# Patient Record
Sex: Female | Born: 1995 | Race: Black or African American | Hispanic: No | Marital: Single | Smoking: Former smoker
Health system: Southern US, Community
[De-identification: ages and names within clinical notes are randomized; demographics above are authoritative.]

## PROBLEM LIST (undated history)

## (undated) DIAGNOSIS — I10 Essential (primary) hypertension: Secondary | ICD-10-CM

## (undated) DIAGNOSIS — R030 Elevated blood-pressure reading, without diagnosis of hypertension: Secondary | ICD-10-CM

## (undated) DIAGNOSIS — R569 Unspecified convulsions: Secondary | ICD-10-CM

## (undated) DIAGNOSIS — K219 Gastro-esophageal reflux disease without esophagitis: Secondary | ICD-10-CM

## (undated) HISTORY — DX: Unspecified convulsions: R56.9

## (undated) HISTORY — DX: Elevated blood-pressure reading, without diagnosis of hypertension: R03.0

## (undated) HISTORY — DX: Essential (primary) hypertension: I10

## (undated) HISTORY — DX: Gastro-esophageal reflux disease without esophagitis: K21.9

---
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
## 2016-05-01 ENCOUNTER — Encounter (HOSPITAL_COMMUNITY): Payer: Self-pay

## 2016-05-01 ENCOUNTER — Emergency Department (HOSPITAL_COMMUNITY): Payer: Self-pay

## 2016-05-01 ENCOUNTER — Emergency Department (HOSPITAL_COMMUNITY)
Admission: EM | Admit: 2016-05-01 | Discharge: 2016-05-01 | Disposition: A | Discharge status: Home / Self Care | Payer: Self-pay | Attending: Emergency Medicine | Admitting: Emergency Medicine

## 2016-05-01 DIAGNOSIS — Z87891 Personal history of nicotine dependence: Secondary | ICD-10-CM | POA: Insufficient documentation

## 2016-05-01 DIAGNOSIS — R569 Unspecified convulsions: Secondary | ICD-10-CM | POA: Insufficient documentation

## 2016-05-01 DIAGNOSIS — R51 Headache: Secondary | ICD-10-CM | POA: Insufficient documentation

## 2016-05-01 DIAGNOSIS — E876 Hypokalemia: Secondary | ICD-10-CM | POA: Insufficient documentation

## 2016-05-01 LAB — URINALYSIS, ROUTINE W REFLEX MICROSCOPIC
Bilirubin Urine: NEGATIVE
GLUCOSE, UA: NEGATIVE mg/dL
Hgb urine dipstick: NEGATIVE
Ketones, ur: NEGATIVE mg/dL
LEUKOCYTES UA: NEGATIVE
Nitrite: NEGATIVE
PH: 8 (ref 5.0–8.0)
PROTEIN: NEGATIVE mg/dL
SPECIFIC GRAVITY, URINE: 1.019 (ref 1.005–1.030)

## 2016-05-01 LAB — RAPID URINE DRUG SCREEN, HOSP PERFORMED
AMPHETAMINES: NOT DETECTED
BARBITURATES: NOT DETECTED
BENZODIAZEPINES: NOT DETECTED
Cocaine: NOT DETECTED
Opiates: NOT DETECTED
TETRAHYDROCANNABINOL: POSITIVE — AB

## 2016-05-01 LAB — CBC WITH DIFFERENTIAL/PLATELET
BASOS ABS: 0 10*3/uL (ref 0.0–0.1)
Basophils Relative: 0 %
EOS ABS: 0 10*3/uL (ref 0.0–0.7)
EOS PCT: 0 %
HEMATOCRIT: 43.5 % (ref 36.0–46.0)
Hemoglobin: 14.5 g/dL (ref 12.0–15.0)
LYMPHS PCT: 21 %
Lymphs Abs: 2 10*3/uL (ref 0.7–4.0)
MCH: 30.8 pg (ref 26.0–34.0)
MCHC: 33.3 g/dL (ref 30.0–36.0)
MCV: 92.4 fL (ref 78.0–100.0)
MONO ABS: 0.6 10*3/uL (ref 0.1–1.0)
Monocytes Relative: 7 %
Neutro Abs: 6.8 10*3/uL (ref 1.7–7.7)
Neutrophils Relative %: 72 %
Platelets: 242 10*3/uL (ref 150–400)
RBC: 4.71 MIL/uL (ref 3.87–5.11)
RDW: 13.5 % (ref 11.5–15.5)
WBC: 9.5 10*3/uL (ref 4.0–10.5)

## 2016-05-01 LAB — POC URINE PREG, ED: Preg Test, Ur: NEGATIVE

## 2016-05-01 LAB — COMPREHENSIVE METABOLIC PANEL
ALBUMIN: 4.4 g/dL (ref 3.5–5.0)
ALK PHOS: 64 U/L (ref 38–126)
ALT: 9 U/L — AB (ref 14–54)
AST: 34 U/L (ref 15–41)
Anion gap: 11 (ref 5–15)
BILIRUBIN TOTAL: 0.5 mg/dL (ref 0.3–1.2)
CALCIUM: 10.1 mg/dL (ref 8.9–10.3)
CO2: 25 mmol/L (ref 22–32)
CREATININE: 0.7 mg/dL (ref 0.44–1.00)
Chloride: 103 mmol/L (ref 101–111)
GFR calc Af Amer: >60 mL/min (ref >60)
GLUCOSE: 83 mg/dL (ref 65–99)
POTASSIUM: 3.4 mmol/L — AB (ref 3.5–5.1)
Sodium: 139 mmol/L (ref 135–145)
TOTAL PROTEIN: 7.7 g/dL (ref 6.5–8.1)

## 2016-05-01 LAB — TROPONIN I: Troponin I: <0.03 ng/mL (ref <0.03)

## 2016-05-01 LAB — MAGNESIUM: MAGNESIUM: 2.1 mg/dL (ref 1.7–2.4)

## 2016-05-01 MED ORDER — ACETAMINOPHEN 500 MG PO TABS
1000.0000 mg | ORAL_TABLET | Freq: Once | ORAL | Status: AC
  Administered 2016-05-01: 1000 mg via ORAL
  Filled 2016-05-01: qty 2

## 2016-05-01 NOTE — ED Provider Notes (Signed)
[facility]ENCY DEPT Provider Note   CSN: [id number] Arrival date & time: 05/01/16  1605     History   Chief Complaint Chief Complaint  Patient presents with  . Seizures    HPI Meghan Williams is a 20 y.o. female.  HPI Patient has no seizure history. Medical history is for borderline hypertension that has not required treatment in the past. Patient reports that she was feeling well. She had no preceding symptoms. She was in the room with her cousin and she suddenly started convulsing. Her mother was called and came to the room. Her mother describes tonic-clonic activity of both arms and legs with foaming at the mouth. Her mother reports that she was wearing a lot of clothing including a large, fuzzy onesie. Her mother reports that she was sweaty and hot. She reports when she took her out of this this seizure seemed to stop. It took her several more minutes before she recognize her surroundings. Gradually on the way to the emergency department she has regained completely normal mental status. He denies any regular medications. She occasionally takes an NSAID for menstrual cramps. She has no seizure history. Was no injury associated. The patient was on a carpeted floor. There was no associated fall. Patient is denying headache, visual changes, gait instability. She is in college and she reports she has been feeling well. She is currently home visiting family. History reviewed. No pertinent past medical history.  There are no active problems to display for this patient.   History reviewed. No pertinent surgical history.  OB History    No data available       Home Medications    Prior to Admission medications   Medication Sig Start Date End Date Taking? Authorizing Provider  ibuprofen (ADVIL,MOTRIN) 200 MG tablet Take 400 mg by mouth every 6 (six) hours as needed.   Yes Historical Provider, MD    Family History No family history on file.  Social History Social History    Substance Use Topics  . Smoking status: Former Games developer  . Smokeless tobacco: Never Used  . Alcohol use Yes     Allergies   Review of patient's allergies indicates not on file.   Review of Systems Review of Systems 10 Systems reviewed and are negative for acute change except as noted in the HPI.  Physical Exam Updated Vital Signs BP 153/94 (BP Location: Left Arm)   Pulse 68   Temp 99 F (37.2 C) (Oral)   Resp 16   Ht 5\' 9"  (1.753 m)   Wt 123 lb (55.8 kg)   LMP 04/20/2016 (Exact Date)   SpO2 100%   BMI 18.16 kg/m   Physical Exam  Constitutional: She is oriented to person, place, and time. She appears well-developed and well-nourished. No distress.  HENT:  Head: Normocephalic and atraumatic.  Right Ear: External ear normal.  Left Ear: External ear normal.  Nose: Nose normal.  Mouth/Throat: Oropharynx is clear and moist.  Bilateral TMs normal. Dentition excellent condition. Mucous memories are pink and moist. No tongue laceration.  Eyes: Conjunctivae and EOM are normal. Pupils are equal, round, and reactive to light.  No nystagmus. No scleral injection  Neck: Normal range of motion. Neck supple.  No meningismus. No posterior cervical pain or bony tenderness. Normal soft tissues without lymphadenopathy or mass.  Cardiovascular: Normal rate and regular rhythm.   No murmur heard. Pulmonary/Chest: Effort normal and breath sounds normal. No respiratory distress.  Abdominal: Soft. There is no tenderness.  Musculoskeletal:  Normal range of motion. She exhibits no edema, tenderness or deformity.  No contusions or abrasions to extremities.  Neurological: She is alert and oriented to person, place, and time. No cranial nerve deficit. She exhibits normal muscle tone. Coordination normal.  Normal heel shin and normal finger-nose exam. Cognitive function and speech are normal.  Skin: Skin is warm and dry.  Psychiatric: She has a normal mood and affect.  Nursing note and vitals  reviewed.    ED Treatments / Results  Labs (all labs ordered are listed, but only abnormal results are displayed) Labs Reviewed  COMPREHENSIVE METABOLIC PANEL - Abnormal; Notable for the following:       Result Value   Potassium 3.4 (*)    BUN <5 (*)    ALT 9 (*)    All other components within normal limits  URINALYSIS, ROUTINE W REFLEX MICROSCOPIC (NOT AT [facility]) - Abnormal; Notable for the following:    APPearance CLOUDY (*)    All other components within normal limits  URINE RAPID DRUG SCREEN, HOSP PERFORMED - Abnormal; Notable for the following:    Tetrahydrocannabinol POSITIVE (*)    All other components within normal limits  CBC WITH DIFFERENTIAL/PLATELET  MAGNESIUM  TROPONIN I  PROLACTIN  POC URINE PREG, ED    EKG  EKG Interpretation  Date/Time:  Thursday May 01 2016 16:25:25 EDT Ventricular Rate:  79 PR Interval:    QRS Duration: 68 QT Interval:  405 QTC Calculation: 465 R Axis:   80 Text Interpretation:  Sinus arrhythmia Probable left atrial enlargement Nonspecific T abnrm, anterolateral leads ST elev, probable normal early repol pattern agree. no old comparison. Confirmed by Donnald GarrePfeiffer, MD, Lebron ConnersMarcy [phone]) on 05/01/2016 5:24:42 PM       Radiology Ct Head Wo Contrast  Result Date: 05/01/2016 CLINICAL DATA:  New onset seizure.  Frontal headache. EXAM: CT HEAD WITHOUT CONTRAST TECHNIQUE: Contiguous axial images were obtained from the base of the skull through the vertex without intravenous contrast. COMPARISON:  None. FINDINGS: Brain: No mass lesion, intraparenchymal hemorrhage or extra-axial collection. No evidence of acute cortical infarct. Brain parenchyma and CSF-containing spaces are normal for age. Vascular: No hyperdense vessel or unexpected calcification. Skull: Normal visualized skull base, calvarium and extracranial soft tissues. Sinuses/Orbits: No sinus fluid levels or advanced mucosal thickening. No mastoid effusion. Normal orbits. IMPRESSION: Normal head  CT. Electronically Signed   By: Deatra RobinsonKevin  Herman M.D.   On: 05/01/2016 17:37    Procedures Procedures (including critical care time)  Medications Ordered in ED Medications  acetaminophen (TYLENOL) tablet 1,000 mg (1,000 mg Oral Given 05/01/16 1806)     Initial Impression / Assessment and Plan / ED Course  I have reviewed the triage vital signs and the nursing notes.  Pertinent labs & imaging results that were available during my care of the patient were reviewed by me and considered in my medical decision making (see chart for details).  Clinical Course    Final Clinical Impressions(s) / ED Diagnoses   Final diagnoses:  Seizure (HCC)  Hypokalemia   Patient is clinically well. She does not give any positives on review of systems to suggest incrementally developing neurologic symptoms. There is  no headache history or incoordination or visual change. Patient is well-nourished well-developed. Patient has had borderline hypertension in the past. Diastolic blood pressure has been consistently in the 90s to 100. Patient is asymptomatic with this. I do not suspect seizure to be secondary to hypertension. I have however advised the patient that she  must follow-up and have her blood pressure consistently monitored to determine if she needs to be treated. She is aware of the risks and problems with chronically untreated hypertension as her father has history of chronic hypertension and is now experiencing complications. New Prescriptions New Prescriptions   No medications on file     Arby Barrette, MD 05/01/16 2057

## 2016-05-01 NOTE — ED Triage Notes (Signed)
Pt was witnessed having a grand mal seizure this afternoon. Family witnessed ppt. Having seizure like activity while using her phone and afterwards was unable to form coherent sentences. No incontinence, no biting of the lip. Pt doesn't remember the event. Currently stable

## 2016-05-01 NOTE — ED Notes (Signed)
Pt. Complaining of pai with  IV in the L wrist placed by EMS  [facility]

## 2016-05-02 ENCOUNTER — Ambulatory Visit (INDEPENDENT_AMBULATORY_CARE_PROVIDER_SITE_OTHER): Payer: PRIVATE HEALTH INSURANCE | Admitting: Neurology

## 2016-05-02 ENCOUNTER — Encounter: Payer: Self-pay | Admitting: Neurology

## 2016-05-02 VITALS — BP 139/90 | HR 59 | Ht 69.0 in | Wt 120.0 lb

## 2016-05-02 DIAGNOSIS — R569 Unspecified convulsions: Secondary | ICD-10-CM | POA: Diagnosis not present

## 2016-05-02 NOTE — Patient Instructions (Signed)
   No driving for 6 months.  We will check MRI of the brain and an EEG study.

## 2016-05-02 NOTE — Progress Notes (Signed)
Reason for visit: Seizures  Referring physician: Wisner  Meghan Williams is a 20 y.o. female  History of present illness:  Meghan Williams is a 20 year old left-handed black female with a history of new onset seizures. The patient was seen in the emergency room yesterday after a generalized seizure at home that was witnessed. The patient was watching TV, she had no warning of the seizure and then suddenly blacked out with stiffening and jerking of the arms. The patient did not bite her tongue or lose control the bowels or the bladder. The patient was taken to the hospital, a urine drug screen was positive for THC, otherwise negative. CT scan of the brain was unremarkable. Blood work showed a slightly low potassium level. The patient had a headache following the event that now has cleared. The patient reports no numbness or weakness of the face, arms, or legs. The patient denies imbalance issues. There have been no reports of head trauma in the past or a history of prior seizures. The maternal grandmother and maternal grandfather had seizures. The patient is sent to this office for further evaluation.  Past Medical History:  Diagnosis Date  . Borderline hypertension     History reviewed. No pertinent surgical history.  Family History  Problem Relation Age of Onset  . Hypertension Father   . Cervical cancer Maternal Grandmother   . Hypertension Maternal Grandmother     Social history:  reports that she has quit smoking. She has never used smokeless tobacco. She reports that she drinks alcohol. She reports that she uses drugs, including Marijuana.  Medications:  Prior to Admission medications   Medication Sig Start Date End Date Taking? Authorizing Provider  ibuprofen (ADVIL,MOTRIN) 200 MG tablet Take 400 mg by mouth every 6 (six) hours as needed.   Yes Historical Provider, MD     No Known Allergies  ROS:  Out of a complete 14 system review of symptoms, the patient complains only of  the following symptoms, and all other reviewed systems are negative.  Chills, weight loss Muscle cramps Skin sensitivity Memory loss, confusion, headache, dizziness, seizure, passing out Anxiety, not enough sleep  Blood pressure 139/90, pulse (!) 59, height 5\' 9"  (1.753 m), weight 120 lb (54.4 kg), last menstrual period 04/20/2016.  Physical Exam  General: The patient is alert and cooperative at the time of the examination.  Eyes: Pupils are equal, round, and reactive to light. Discs are flat bilaterally.  Neck: The neck is supple, no carotid bruits are noted.  Respiratory: The respiratory examination is clear.  Cardiovascular: The cardiovascular examination reveals a regular rate and rhythm, no obvious murmurs or rubs are noted.  Skin: Extremities are without significant edema.  Neurologic Exam  Mental status: The patient is alert and oriented x 3 at the time of the examination. The patient has apparent normal recent and remote memory, with an apparently normal attention span and concentration ability.  Cranial nerves: Facial symmetry is present. There is good sensation of the face to pinprick and soft touch bilaterally. The strength of the facial muscles and the muscles to head turning and shoulder shrug are normal bilaterally. Speech is well enunciated, no aphasia or dysarthria is noted. Extraocular movements are full. Visual fields are full. The tongue is midline, and the patient has symmetric elevation of the soft palate. No obvious hearing deficits are noted.  Motor: The motor testing reveals 5 over 5 strength of all 4 extremities. Good symmetric motor tone is noted throughout.  Sensory: Sensory testing is intact to pinprick, soft touch, vibration sensation, and position sense on all 4 extremities. No evidence of extinction is noted.  Coordination: Cerebellar testing reveals good finger-nose-finger and heel-to-shin bilaterally.  Gait and station: Gait is normal. Tandem gait  is normal. Romberg is negative. No drift is seen.  Reflexes: Deep tendon reflexes are symmetric and normal bilaterally. Toes are downgoing bilaterally.   CT head 05/01/16:  IMPRESSION: Normal head CT.   Assessment/Plan:  1. New onset seizure  The patient will be sent for further evaluation. She will have MRI of the brain with and without gadolinium enhancement, and an EEG study. We will not place her on anticonvulsant medications at this time. The patient will follow-up in 6 months, they are to contact our office if another event occurs. She is not to operate a motor vehicle for least 6 months.  Marlan Palau MD 05/02/2016 10:54 AM  [facility] [address] [place], [place] [address]  Phone [phone] Fax [phone]

## 2016-05-29 ENCOUNTER — Other Ambulatory Visit: Payer: PRIVATE HEALTH INSURANCE

## 2016-12-09 ENCOUNTER — Other Ambulatory Visit: Payer: Self-pay

## 2016-12-09 ENCOUNTER — Ambulatory Visit (INDEPENDENT_AMBULATORY_CARE_PROVIDER_SITE_OTHER): Payer: PRIVATE HEALTH INSURANCE | Admitting: Neurology

## 2016-12-09 ENCOUNTER — Encounter (INDEPENDENT_AMBULATORY_CARE_PROVIDER_SITE_OTHER): Payer: Self-pay

## 2016-12-09 ENCOUNTER — Encounter: Payer: Self-pay | Admitting: Neurology

## 2016-12-09 ENCOUNTER — Telehealth: Payer: Self-pay | Admitting: Neurology

## 2016-12-09 VITALS — BP 117/77 | HR 70 | Ht 69.0 in | Wt 119.0 lb

## 2016-12-09 DIAGNOSIS — R569 Unspecified convulsions: Secondary | ICD-10-CM

## 2016-12-09 MED ORDER — LEVETIRACETAM 500 MG PO TABS: 500.0000 mg | ORAL_TABLET | Freq: Two times a day (BID) | ORAL | 5 refills | Status: DC

## 2016-12-09 NOTE — Telephone Encounter (Signed)
Mother came back to the office to state Rx needs to go to [facility] [address].

## 2016-12-09 NOTE — Patient Instructions (Signed)
   We will start keppra 500 mg 1/2 tablet twice a day for 2 weeks, then take one tablet twice a day.  Daylene [email or web address]

## 2016-12-09 NOTE — Telephone Encounter (Signed)
Medication sent to [facility] [address] in [place] per moms request. [facility] pharmacy taken out of system.

## 2016-12-09 NOTE — Progress Notes (Signed)
Reason for visit: Seizures  Meghan Williams is an 21 y.o. female  History of present illness:  Meghan Williams is a 21 year old left-handed black female with a history of a seizure that occurred on 05/01/2016. The patient was seen for an evaluation, she was set up for MRI evaluation of the brain and an EEG study on her October 6 visit through this office. The patient was not placed on anticonvulsant medications at that time, she never had the MRI and never had the EEG study. The patient had a recurrent seizure on 05/09/2016 and another on 11/14/2016. The patient never contacted our office regarding these recurring seizures. The most recent seizure occurred while she was sitting in a car, she had no warning, the person she was with realized that she was not responding to her questions, and she was noted to have generalized jerking. The patient bit the inside of her cheeks bilaterally, she did not lose bowel or bladder control. The patient has not been placed on anticonvulsant therapy. A urine drug screen was positive for THC, otherwise negative. A CT scan of brain was done and was unremarkable in November 2017. She returns to this office for an evaluation.  Past Medical History:  Diagnosis Date  . Borderline hypertension   . Seizures (HCC)     History reviewed. No pertinent surgical history.  Family History  Problem Relation Age of Onset  . Hypertension Father   . Cervical cancer Maternal Grandmother   . Hypertension Maternal Grandmother   . Seizures Maternal Grandmother   . Seizures Maternal Grandfather     Social history:  reports that she has quit smoking. She has never used smokeless tobacco. She reports that she drinks about 1.2 oz of alcohol per week . She reports that she uses drugs, including Marijuana.   No Known Allergies  Medications:  Prior to Admission medications   Medication Sig Start Date End Date Taking? Authorizing Provider  ibuprofen (ADVIL,MOTRIN) 200 MG tablet Take  400 mg by mouth every 6 (six) hours as needed.   Yes [provider]    ROS:  Out of a complete 14 system review of symptoms, the patient complains only of the following symptoms, and all other reviewed systems are negative.  Blurred vision Headache, seizure, weakness Anxiety  Blood pressure 117/77, pulse 70, height 5\' 9"  (1.753 m), weight 119 lb (54 kg).  Physical Exam  General: The patient is alert and cooperative at the time of the examination.  Skin: No significant peripheral edema is noted.   Neurologic Exam  Mental status: The patient is alert and oriented x 3 at the time of the examination. The patient has apparent normal recent and remote memory, with an apparently normal attention span and concentration ability.   Cranial nerves: Facial symmetry is present. Speech is normal, no aphasia or dysarthria is noted. Extraocular movements are full. Visual fields are full.  Motor: The patient has good strength in all 4 extremities.  Sensory examination: Soft touch sensation is symmetric on the face, arms, and legs.  Coordination: The patient has good finger-nose-finger and heel-to-shin bilaterally.  Gait and station: The patient has a normal gait. Tandem gait is normal. Romberg is negative. No drift is seen.  Reflexes: Deep tendon reflexes are symmetric.   Assessment/Plan:  1. History of generalized seizures  The patient will once again be set up for MRI evaluation of the brain with and without gadolinium enhancement, and an EEG study. She is not to operate a  motor vehicle for least 6 months after the last seizure. The patient will be placed on Keppra at 500 mg twice daily. The patient will follow-up in 3 months. She will contact me if there are problems tolerating the medication or if she has another seizure.  Marlan Palau MD 12/09/2016 4:20 PM  [facility] [address] [place], [place] [address]  Phone [phone] Fax  [phone]

## 2016-12-17 ENCOUNTER — Other Ambulatory Visit: Payer: Self-pay

## 2016-12-18 ENCOUNTER — Ambulatory Visit (INDEPENDENT_AMBULATORY_CARE_PROVIDER_SITE_OTHER): Payer: PRIVATE HEALTH INSURANCE | Admitting: Neurology

## 2016-12-18 ENCOUNTER — Telehealth: Payer: Self-pay | Admitting: Neurology

## 2016-12-18 DIAGNOSIS — R569 Unspecified convulsions: Secondary | ICD-10-CM

## 2016-12-18 NOTE — Procedures (Signed)
    History:  Meghan Williams is a 21 year old patient with a history of several seizure-type events occurring on 05/01/2016, and again on 05/09/2016. The patient had another witnessed the event on the 20th of April 2018. The patient was noted to have a period of unresponsiveness followed by generalized jerking. The patient did bite her tongue. She is being evaluated for these events.  This is a routine EEG. No skull defects are noted. Medications include ibuprofen. The patient is now on Keppra as well.   EEG classification: Normal awake  Description of the recording: The background rhythms of this recording consists of a fairly well modulated medium amplitude alpha rhythm of 11 Hz that is reactive to eye opening and closure. As the record progresses, the patient appears to remain in the waking state throughout the recording. Photic stimulation was performed, resulting in a bilateral and symmetric photic driving response. Hyperventilation was also performed, resulting in a minimal buildup of the background rhythm activities without significant slowing seen. At no time during the recording does there appear to be evidence of spike or spike wave discharges or evidence of focal slowing. EKG monitor shows no evidence of cardiac rhythm abnormalities with a heart rate of 60.  Impression: This is a normal EEG recording in the waking state. No evidence of ictal or interictal discharges are seen.

## 2016-12-18 NOTE — Telephone Encounter (Signed)
I called the patient, talk with the mother. The EEG study is normal.  The MRI the brain will be done next week.  The patient does not have a primary care physician.

## 2016-12-23 ENCOUNTER — Ambulatory Visit
Admission: RE | Admit: 2016-12-23 | Discharge: 2016-12-23 | Disposition: A | Discharge status: Home / Self Care | Payer: BLUE CROSS/BLUE SHIELD | Source: Ambulatory Visit | Attending: Neurology | Admitting: Neurology

## 2016-12-23 ENCOUNTER — Other Ambulatory Visit: Payer: Self-pay | Admitting: Neurology

## 2016-12-23 DIAGNOSIS — R569 Unspecified convulsions: Secondary | ICD-10-CM | POA: Diagnosis not present

## 2016-12-25 ENCOUNTER — Telehealth: Payer: Self-pay | Admitting: Neurology

## 2016-12-25 NOTE — Telephone Encounter (Signed)
I called patient. MRI the brain was normal. The patient is on Keppra for seizures.   MRI brain 12/24/16:  IMPRESSION:  Normal MRI brain (with and without). No acute findings.

## 2017-09-11 ENCOUNTER — Ambulatory Visit (INDEPENDENT_AMBULATORY_CARE_PROVIDER_SITE_OTHER): Payer: BLUE CROSS/BLUE SHIELD | Admitting: Family Medicine

## 2017-09-11 ENCOUNTER — Encounter: Payer: Self-pay | Admitting: Family Medicine

## 2017-09-11 VITALS — BP 118/76 | Ht 69.0 in | Wt 123.2 lb

## 2017-09-11 DIAGNOSIS — Z79899 Other long term (current) drug therapy: Secondary | ICD-10-CM | POA: Diagnosis not present

## 2017-09-11 DIAGNOSIS — R569 Unspecified convulsions: Secondary | ICD-10-CM

## 2017-09-11 LAB — COMPREHENSIVE METABOLIC PANEL
ALBUMIN: 4 g/dL (ref 3.5–5.2)
ALT: 6 U/L (ref 0–35)
AST: 20 U/L (ref 0–37)
Alkaline Phosphatase: 58 U/L (ref 39–117)
BILIRUBIN TOTAL: 0.3 mg/dL (ref 0.2–1.2)
BUN: 8 mg/dL (ref 6–23)
CALCIUM: 9.3 mg/dL (ref 8.4–10.5)
CO2: 27 mEq/L (ref 19–32)
CREATININE: 0.66 mg/dL (ref 0.40–1.20)
Chloride: 105 mEq/L (ref 96–112)
GFR: 142.99 mL/min (ref >60.00)
Glucose, Bld: 91 mg/dL (ref 70–99)
Potassium: 4.1 mEq/L (ref 3.5–5.1)
Sodium: 141 mEq/L (ref 135–145)
Total Protein: 6.2 g/dL (ref 6.0–8.3)

## 2017-09-11 NOTE — Progress Notes (Signed)
Subjective:  Patient ID: Meghan Williams, female    DOB: 01-30-1996  Age: 22 y.o. MRN: [id number]  CC: Establish Care   HPI Meghan Williams presents for evaluation status post seizures x2 this past Monday.  She is here with her mother.  She was seen in the emergency room for evaluation and started on Keppra 500 mg bid with 5 RFs.  For the first week she was directed to take one half a pill twice a day and then move up to 1 full pill twice a day.  She just started the medicine 3 days ago.  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  She is planning on following up with [facility] early in May.   her seizures actually began last year.  She was also evaluated in [place] for a seizure back in 4/18 where a CT of her head was normal.  Urine drug screen positive for THC.  She followed up with neurologist and [place] back in 5/18.  MRI and EEGs were both normal.  She was started on Keppra at that time.  Her parents decided  that she should not take the Keppra because the work up had been negative and she did not have a prior seizure history.  Patient has not had a history of stroke, trauma to the brain, infection of the brain or prior seizure history as a child.  She denies excessive alcohol use at school.  Her maternal gmother had a ho seizures.  She is sexually active and never had a Pap smear.  History Meghan Williams has a past medical history of Borderline hypertension and Seizures (HCC).   She has no past surgical history on file.   Her family history includes Cervical cancer in her maternal grandmother; Hypertension in her father and maternal grandmother; Seizures in her maternal grandfather and maternal grandmother.She reports that she has quit smoking. she has never used smokeless tobacco. She reports that she drinks about 1.2 oz of alcohol per week. She reports that she uses drugs. Drug:  Marijuana.  Outpatient Medications Prior to Visit  Medication Sig Dispense Refill  . levETIRAcetam (KEPPRA) 500 MG tablet Take 1 tablet (500 mg total) by mouth 2 (two) times daily. 60 tablet 5  . nitrofurantoin, macrocrystal-monohydrate, (MACROBID) 100 MG capsule Take 1 capsule by mouth 2 (two) times daily.    Marland Kitchen ibuprofen (ADVIL,MOTRIN) 200 MG tablet Take 400 mg by mouth every 6 (six) hours as needed.     No facility-administered medications prior to visit.     ROS Review of Systems  Constitutional: Negative.   HENT: Negative.   Eyes: Negative.   Respiratory: Negative.   Cardiovascular: Negative.   Gastrointestinal: Negative.   Endocrine: Negative.   Genitourinary: Negative.   Neurological: Positive for seizures. Negative for speech difficulty and headaches.  Psychiatric/Behavioral: Negative.     Objective:  BP 118/76 (BP Location: Left Arm, Patient Position: Sitting, Cuff Size: Normal)   Ht 5\' 9"  (1.753 m)   Wt 123 lb 4 oz (55.9 kg)   BMI 18.20 kg/m   Physical Exam  Constitutional: She is oriented to person, place, and time. She appears well-developed and well-nourished. No distress.  HENT:  Head: Normocephalic and atraumatic.  Right Ear: External ear normal.  Left Ear: External ear normal.  Mouth/Throat: Oropharynx is clear and moist. No oropharyngeal exudate.  Eyes: Conjunctivae and EOM are  normal. Pupils are equal, round, and reactive to light. Right eye exhibits no discharge. Left eye exhibits no discharge. No scleral icterus.  Neck: Neck supple. No JVD present. No tracheal deviation present. No thyromegaly present.  Cardiovascular: Normal rate, regular rhythm and normal heart sounds.  Pulmonary/Chest: Effort normal and breath sounds normal. No stridor.  Abdominal: Soft. Bowel sounds are normal.  Musculoskeletal: Normal range of motion.  Lymphadenopathy:    She has no cervical adenopathy.  Neurological: She is alert and oriented to person, place, and time. She has  normal strength. She displays no atrophy and no tremor. No cranial nerve deficit. She exhibits normal muscle tone. She displays no seizure activity. Coordination normal.  Skin: Skin is warm and dry. She is not diaphoretic.  Psychiatric: She has a normal mood and affect. Her behavior is normal.      Assessment & Plan:   Meghan Newcomerneysiya was seen today for establish care.  Diagnoses and all orders for this visit:  Convulsions, unspecified convulsion type (HCC) -     HIV antibody -     RPR  High risk medication use -     Comprehensive metabolic panel   I have discontinued Meghan Williams ibuprofen. I am also having her maintain her levETIRAcetam and nitrofurantoin (macrocrystal-monohydrate).  No orders of the defined types were placed in this encounter.  Information about seizures and Keppra was given to the patient and her mother.  They are planning on scheduling a follow-up appointment with [facility] in May.  We are checking baseline liver and kidney function today.  Advised her to abstain from alcohol or other illicit drugs.  Advised her to have a Pap smear.  Follow-up: Return in about 3 months (around 12/09/2017).  Mliss SaxWilliam Alfred Kremer, MD

## 2017-09-11 NOTE — Patient Instructions (Addendum)
Seizure, Adult A seizure is a sudden burst of abnormal electrical activity in the brain. The abnormal activity temporarily interrupts normal brain function, causing a person to experience any of the following:  Involuntary movements.  Changes in awareness or consciousness.  Uncontrollable shaking (convulsions).  Seizures usually last from 30 seconds to 2 minutes. They usually do not cause permanent brain damage unless they are prolonged. What can cause a seizure to happen? Seizures can happen for many reasons including:  A fever.  Low blood sugar.  A medicine.  An illnesses.  A brain injury.  Some people who have a seizure never have another one. People who have repeated seizures have a condition called epilepsy. What are the symptoms of a seizure? Symptoms of a seizure vary greatly from person to person. They include:  Convulsions.  Stiffening of the body.  Involuntary movements of the arms or legs.  Loss of consciousness.  Breathing problems.  Falling suddenly.  Confusion.  Head nodding.  Eye blinking or fluttering.  Lip smacking.  Drooling.  Rapid eye movements.  Grunting.  Loss of bladder control and bowel control.  Staring.  Unresponsiveness.  Some people have symptoms right before a seizure happens (aura) and right after a seizure happens. Symptoms of an aura include:  Fear or anxiety.  Nausea.  Feeling like the room is spinning (vertigo).  A feeling of having seen or heard something before (deja vu).  Odd tastes or smells.  Changes in vision, such as seeing flashing lights or spots.  Symptoms that may follow a seizure include:  Confusion.  Sleepiness.  Headache.  Weakness of one side of the body.  Follow these instructions at home: Medicines   Take over-the-counter and prescription medicines only as told by your health care provider.  Avoid any substances that may prevent your medicine from working properly, such as  alcohol. Activity  Do not drive, swim, or do any other activities that would be dangerous if you had another seizure. Wait until your health care provider approves.  If you live in the [place] check with your local DMV (department of motor vehicles) to find out about the local driving laws. Each state has specific rules about when you can legally return to driving.  Get enough rest. Lack of sleep can make seizures more likely to occur. Educating others Teach friends and family what to do if you have a seizure. They should:  Lay you on the ground to prevent a fall.  Cushion your head and body.  Loosen any tight clothing around your neck.  Turn you on your side. If vomiting occurs, this helps keep your airway clear.  Stay with you until you recover.  Not hold you down. Holding you down will not stop the seizure.  Not put anything in your mouth.  Know whether or not you need emergency care.  General instructions  Contact your health care provider each time you have a seizure.  Avoid anything that has ever triggered a seizure for you.  Keep a seizure diary. Record what you remember about each seizure, especially anything that might have triggered the seizure.  Keep all follow-up visits as told by your health care provider. This is important. Contact a health care provider if:  You have another seizure.  You have seizures more often.  Your seizure symptoms change.  You continue to have seizures with treatment.  You have symptoms of an infection or illness. They might increase your risk of having a seizure. Get help   right away if:  You have a seizure: ? That lasts longer than 5 minutes. ? That is different than previous seizures. ? That leaves you unable to speak or use a part of your body. ? That makes it harder to breathe. ? After a head injury.  You have: ? Multiple seizures in a row. ? Confusion or a severe headache right after a seizure.  You are having  seizures more often.  You do not wake up immediately after a seizure.  You injure yourself during a seizure. These symptoms may represent a serious problem that is an emergency. Do not wait to see if the symptoms will go away. Get medical help right away. Call your local emergency services (911 in the [place] Do not drive yourself to the hospital. This information is not intended to replace advice given to you by your health care provider. Make sure you discuss any questions you have with your health care provider. Document Released: 07/11/2000 Document Revised: 03/09/2016 Document Reviewed: 02/15/2016 Elsevier Interactive Patient Education  2018 [employer]  Epilepsy Epilepsy is a condition in which a person has repeated seizures over time. A seizure is a sudden burst of abnormal electrical and chemical activity in the brain. Seizures can cause a change in attention, behavior, or the ability to remain awake and alert (altered mental status). Epilepsy increases a person's risk of falls, accidents, and injury. It can also lead to complications, including:  Depression.  Poor memory.  Sudden unexplained death in epilepsy (SUDEP). This complication is rare, and its cause is not known.  Most people with epilepsy lead normal lives. What are the causes? This condition may be caused by:  A head injury.  An injury that happens at birth.  A high fever during childhood.  A stroke.  Bleeding that goes into or around the brain.  Certain medicines and drugs.  Having too little oxygen for a long period of time.  Abnormal brain development.  Certain infections, such as meningitis and encephalitis.  Brain tumors.  Conditions that are passed along from parent to child (are hereditary).  What are the signs or symptoms? Symptoms of a seizure vary greatly from person to person. They include:  Convulsions.  Stiffening of the body.  Involuntary movements of the arms or legs.  Loss  of consciousness.  Breathing problems.  Falling suddenly.  Confusion.  Head nodding.  Eye blinking or fluttering.  Lip smacking.  Drooling.  Rapid eye movements.  Grunting.  Loss of bladder control and bowel control.  Staring.  Unresponsiveness.  Some people have symptoms right before a seizure happens (aura) and right after a seizure happens. Symptoms of an aura include:  Fear or anxiety.  Nausea.  Feeling like the room is spinning (vertigo).  A feeling of having seen or heard something before (deja vu).  Odd tastes or smells.  Changes in vision, such as seeing flashing lights or spots.  Symptoms that follow a seizure include:  Confusion.  Sleepiness.  Headache.  How is this diagnosed? This condition is diagnosed based on:  Your symptoms.  Your medical history.  A physical exam.  A neurological exam. A neurological exam is similar to a physical exam. It involves checking your strength, reflexes, coordination, and sensations.  Tests, such as: ? An electroencephalogram (EEG). This is a painless test that creates a diagram of your brain waves. ? An MRI of the brain. ? A CT scan of the brain. ? A lumbar puncture, also called a spinal  tap. ? Blood tests to check for signs of infection or abnormal blood chemistry.  How is this treated? There is no cure for this condition, but treatment can help control seizures. Treatment may involve:  Taking medicines to control seizures. These include medicines to prevent seizures and medicines to stop seizures as they occur.  Having a device called a vagus nerve stimulator implanted in the chest. The device sends electrical impulses to the vagus nerve and to the brain to prevent seizures. This treatment may be recommended if medicines do not help.  Brain surgery. There are several kinds of surgeries that may be done to stop seizures from happening or to reduce how often seizures happen.  Having regular blood  tests. You may need to have blood tests regularly to check that you are getting the right amount of medicine.  Once this condition has been diagnosed, it is important to begin treatment as soon as possible. For some people, epilepsy eventually goes away. Follow these instructions at home: Medicines   Take over-the-counter and prescription medicines only as told by your health care provider.  Avoid any substances that may prevent your medicine from working properly, such as alcohol. Activity  Get enough rest. Lack of sleep can make seizures more likely to occur.  Follow instructions from your health care provider about driving, swimming, and doing any other activities that would be dangerous if you had a seizure. Educating others Teach friends and family what to do if you have a seizure. They should:  Lay you on the ground to prevent a fall.  Cushion your head and body.  Loosen any tight clothing around your neck.  Turn you on your side. If vomiting occurs, this helps keep your airway clear.  Stay with you until you recover.  Not hold you down. Holding you down will not stop the seizure.  Not put anything in your mouth.  Know whether or not you need emergency care.  General instructions  Avoid anything that has ever triggered a seizure for you.  Keep a seizure diary. Record what you remember about each seizure, especially anything that might have triggered the seizure.  Keep all follow-up visits as told by your health care provider. This is important. Contact a health care provider if:  Your seizure pattern changes.  You have symptoms of infection or another illness. This might increase your risk of having a seizure. Get help right away if:  You have a seizure that does not stop after 5 minutes.  You have several seizures in a row without a complete recovery in between seizures.  You have a seizure that makes it harder to breathe.  You have a seizure that is  different from previous seizures.  You have a seizure that leaves you unable to speak or use a part of your body.  You did not wake up immediately after a seizure. This information is not intended to replace advice given to you by your health care provider. Make sure you discuss any questions you have with your health care provider. Document Released: 07/14/2005 Document Revised: 02/09/2016 Document Reviewed: 01/22/2016 Elsevier Interactive Patient Education  2018 [employer]. Levetiracetam tablets What is this medicine? LEVETIRACETAM (lee ve tye RA se tam) is an antiepileptic drug. It is used with other medicines to treat certain types of seizures. This medicine may be used for other purposes; ask your health care provider or pharmacist if you have questions. COMMON BRAND NAME(S): Keppra, Roweepra What should I tell my  health care provider before I take this medicine? They need to know if you have any of these conditions: -kidney disease -suicidal thoughts, plans, or attempt; a previous suicide attempt by you or a family member -an unusual or allergic reaction to levetiracetam, other medicines, foods, dyes, or preservatives -pregnant or trying to get pregnant -breast-feeding How should I use this medicine? Take this medicine by mouth with a glass of water. Follow the directions on the prescription label. Swallow the tablets whole. Do not crush or chew this medicine. You may take this medicine with or without food. Take your doses at regular intervals. Do not take your medicine more often than directed. Do not stop taking this medicine or any of your seizure medicines unless instructed by your doctor or health care professional. Stopping your medicine suddenly can increase your seizures or their severity. A special MedGuide will be given to you by the pharmacist with each prescription and refill. Be sure to read this information carefully each time. Contact your pediatrician or health care  professional regarding the use of this medication in children. While this drug may be prescribed for children as young as 21 years of age for selected conditions, precautions do apply. Overdosage: If you think you have taken too much of this medicine contact a poison control center or emergency room at once. NOTE: This medicine is only for you. Do not share this medicine with others. What if I miss a dose? If you miss a dose, take it as soon as you can. If it is almost time for your next dose, take only that dose. Do not take double or extra doses. What may interact with this medicine? This medicine may interact with the following medications: -carbamazepine -colesevelam -probenecid -sevelamer This list may not describe all possible interactions. Give your health care provider a list of all the medicines, herbs, non-prescription drugs, or dietary supplements you use. Also tell them if you smoke, drink alcohol, or use illegal drugs. Some items may interact with your medicine. What should I watch for while using this medicine? Visit your doctor or health care professional for a regular check on your progress. Wear a medical identification bracelet or chain to say you have epilepsy, and carry a card that lists all your medications. It is important to take this medicine exactly as instructed by your health care professional. When first starting treatment, your dose may need to be adjusted. It may take weeks or months before your dose is stable. You should contact your doctor or health care professional if your seizures get worse or if you have any new types of seizures. You may get drowsy or dizzy. Do not drive, use machinery, or do anything that needs mental alertness until you know how this medicine affects you. Do not stand or sit up quickly, especially if you are an older patient. This reduces the risk of dizzy or fainting spells. Alcohol may interfere with the effect of this medicine. Avoid alcoholic  drinks. The use of this medicine may increase the chance of suicidal thoughts or actions. Pay special attention to how you are responding while on this medicine. Any worsening of mood, or thoughts of suicide or dying should be reported to your health care professional right away. Women who become pregnant while using this medicine may enroll in the [place] Antiepileptic Drug Pregnancy Registry by calling [phone]. This registry collects information about the safety of antiepileptic drug use during pregnancy. What side effects may I notice from receiving  this medicine? Side effects that you should report to your doctor or health care professional as soon as possible: -allergic reactions like skin rash, itching or hives, swelling of the face, lips, or tongue -breathing problems -dark urine -general ill feeling or flu-like symptoms -problems with balance, talking, walking -unusually weak or tired -worsening of mood, thoughts or actions of suicide or dying -yellowing of the eyes or skin Side effects that usually do not require medical attention (report to your doctor or health care professional if they continue or are bothersome): -diarrhea -dizzy, drowsy -headache -loss of appetite This list may not describe all possible side effects. Call your doctor for medical advice about side effects. You may report side effects to FDA at 1-800-FDA-1088. Where should I keep my medicine? Keep out of reach of children. Store at room temperature between 15 and 30 degrees C (59 and 86 degrees F). Throw away any unused medicine after the expiration date. NOTE: This sheet is a summary. It may not cover all possible information. If you have questions about this medicine, talk to your doctor, pharmacist, or health care provider.  2018 Elsevier/Gold Standard (2015-08-16 09:43:54)

## 2017-09-14 LAB — RPR: RPR Ser Ql: NONREACTIVE

## 2017-09-14 LAB — HIV ANTIBODY (ROUTINE TESTING W REFLEX): HIV: NONREACTIVE

## 2017-09-15 ENCOUNTER — Encounter: Payer: Self-pay | Admitting: Family Medicine

## 2017-09-21 ENCOUNTER — Telehealth: Payer: Self-pay | Admitting: Neurology

## 2017-09-21 NOTE — Telephone Encounter (Signed)
Patient apparently was placed on Keppra in May 2018, but obviously was not on the medication until she went to the emergency room on 07 September 2017 with a seizure.  The patient is not tolerating the Keppra, we will get a revisit, her next scheduled revisit is not until May 2019.  We will get something sooner.

## 2017-09-21 NOTE — Telephone Encounter (Signed)
Pts mother called stating that pt has been sleepy and having mood swings while taking Keppra. Stating she has been taking for about 2 weeks and isnt easing up. Pts mother is wanting a call back to see if there is anything that can be done to help.

## 2017-09-22 NOTE — Telephone Encounter (Signed)
Pts mother called requesting a call back to know what they should do till appt on 3/11. Such as should pt come off Keppra or stay on it till seen

## 2017-09-22 NOTE — Telephone Encounter (Signed)
I called the mother.  The patient was placed on Keppra back in May 2018, she never took the medication however.  The patient is having trouble tolerating even one half of a 500 mg tablet twice daily, she has had drowsiness and irritability.  The patient will be seen on 05 October 2017, we will need to switch her to another medication at that time, she is not operate a motor vehicle.

## 2017-09-22 NOTE — Telephone Encounter (Signed)
Pt's mother returned RN's call. She accepted the appt on 3/11, I have scheduled it. FYI

## 2017-09-22 NOTE — Telephone Encounter (Signed)
Called and LVM for mother offering work in appt on 10/05/17 at 730am , check in 715am. Asked her to call back to let us know if that works.

## 2017-10-05 ENCOUNTER — Ambulatory Visit (INDEPENDENT_AMBULATORY_CARE_PROVIDER_SITE_OTHER): Payer: PRIVATE HEALTH INSURANCE | Admitting: Neurology

## 2017-10-05 ENCOUNTER — Encounter: Payer: Self-pay | Admitting: Neurology

## 2017-10-05 ENCOUNTER — Ambulatory Visit: Payer: PRIVATE HEALTH INSURANCE | Admitting: Neurology

## 2017-10-05 ENCOUNTER — Telehealth: Payer: Self-pay | Admitting: Neurology

## 2017-10-05 VITALS — BP 121/74 | HR 71 | Ht 69.0 in | Wt 120.0 lb

## 2017-10-05 DIAGNOSIS — R569 Unspecified convulsions: Secondary | ICD-10-CM | POA: Diagnosis not present

## 2017-10-05 MED ORDER — FOLIC ACID 1 MG PO TABS: 1.0000 mg | ORAL_TABLET | Freq: Every day | ORAL | 3 refills | Status: DC

## 2017-10-05 MED ORDER — LAMOTRIGINE 25 MG PO TABS: 75.0000 mg | ORAL_TABLET | Freq: Every day | ORAL | 1 refills | Status: DC

## 2017-10-05 NOTE — Telephone Encounter (Signed)
This patient did not show for an urgent work in revisit.

## 2017-10-05 NOTE — Progress Notes (Signed)
Reason for visit: Seizures  Meghan Williams is an 22 y.o. female  History of present illness:  Meghan Williams is a 22 year old left-handed black female with a history of recurring seizure events.  The patient was in the emergency room on 07 September 2017 with a seizure.  The patient had been placed on Keppra when she was seen through this office in May 2018, but she never took the medication.  The patient is on a very low dose of Keppra taking 250 mg twice daily, she continues to have drowsiness and irritability on this drug.  She returns for an evaluation.  She is not operating a motor vehicle currently.  Past Medical History:  Diagnosis Date  . Borderline hypertension   . GERD (gastroesophageal reflux disease)   . Hypertension   . Seizures (HCC)     History reviewed. No pertinent surgical history.  Family History  Problem Relation Age of Onset  . Hypertension Father   . Cervical cancer Maternal Grandmother   . Hypertension Maternal Grandmother   . Seizures Maternal Grandmother   . Seizures Maternal Grandfather     Social history:  reports that she has quit smoking. she has never used smokeless tobacco. She reports that she drinks about 1.2 oz of alcohol per week. She reports that she uses drugs. Drug: Marijuana.   No Known Allergies  Medications:  Prior to Admission medications   Medication Sig Start Date End Date Taking? Authorizing Provider  levETIRAcetam (KEPPRA) 500 MG tablet Take 1 tablet (500 mg total) by mouth 2 (two) times daily. 12/09/16  Yes York Spaniel, MD  folic acid (FOLVITE) 1 MG tablet Take 1 tablet (1 mg total) by mouth daily. 10/05/17   York Spaniel, MD  lamoTRIgine (LAMICTAL) 25 MG tablet Take 3 tablets (75 mg total) by mouth daily. 10/05/17   York Spaniel, MD    ROS:  Out of a complete 14 system review of symptoms, the patient complains only of the following symptoms, and all other reviewed systems are negative.  Decreased appetite,  fatigue Chest pain Frequency of urination Agitation, depression, anxiety  Blood pressure 121/74, pulse 71, height 5\' 9"  (1.753 m), weight 120 lb (54.4 kg).  Physical Exam  General: The patient is alert and cooperative at the time of the examination.  Skin: No significant peripheral edema is noted.   Neurologic Exam  Mental status: The patient is alert and oriented x 3 at the time of the examination. The patient has apparent normal recent and remote memory, with an apparently normal attention span and concentration ability.   Cranial nerves: Facial symmetry is present. Speech is normal, no aphasia or dysarthria is noted. Extraocular movements are full. Visual fields are full.  Motor: The patient has good strength in all 4 extremities.  Sensory examination: Soft touch sensation is symmetric on the face, arms, and legs.  Coordination: The patient has good finger-nose-finger and heel-to-shin bilaterally.  Gait and station: The patient has a normal gait. Tandem gait is normal. Romberg is negative. No drift is seen.  Reflexes: Deep tendon reflexes are symmetric.   Assessment/Plan:  1.  History of seizures  The patient is not to operate a motor vehicle for 6 months following the last seizure.  She is not tolerating the Keppra well, she will stay on the current dose for now, she will be placed on Lamictal, working up by 50 mg every 2 weeks until she is at 75 mg twice daily.  At that  point, they will call our office, we will convert her to the 100 mg Lamictal tablets taking 1 twice daily and initiate a taper off of the Keppra.  She will follow-up in 4 months.  Marlan Palau. Keith Azriel Dancy MD 10/05/2017 12:22 PM  [facility] [address] [place], [place] [id number]  Phone [phone] Fax [phone]

## 2017-10-05 NOTE — Patient Instructions (Addendum)
   We will start Lamictal 25 mg tablets:  Week 1-2:  Take one twice a day  Week 3-4: Take 2 twice a day  Week 5-6: Take 3 tablets twice a day and call after 2 weeks, we will start the taper off of Keppra and got to the 100 mg tablets of the Lamictal.  Start folic acid 1 mg daily.   Lamictal (lamotrigine) is a seizure medication that occasionally may be used for other purposes such as peripheral neuropathy pain or certain types of headache. This medication is relatively safe, but occasionally side effects can occur. A skin rash may occur when first starting the medication. As with any seizure medication, depression may worsen on the drug. Other potential side effects include dizziness, headache, drowsiness or insomnia, decreased concentration, or stomach upset. This medication may also be used as a mood stabilizer. If you believe that you are having side effects on the medication, please contact our office.

## 2017-10-11 ENCOUNTER — Emergency Department (HOSPITAL_COMMUNITY)
Admission: EM | Admit: 2017-10-11 | Discharge: 2017-10-11 | Disposition: A | Discharge status: Home / Self Care | Payer: BLUE CROSS/BLUE SHIELD | Attending: Emergency Medicine | Admitting: Emergency Medicine

## 2017-10-11 ENCOUNTER — Other Ambulatory Visit: Payer: Self-pay

## 2017-10-11 ENCOUNTER — Encounter (HOSPITAL_COMMUNITY): Payer: Self-pay | Admitting: Emergency Medicine

## 2017-10-11 DIAGNOSIS — I10 Essential (primary) hypertension: Secondary | ICD-10-CM | POA: Diagnosis not present

## 2017-10-11 DIAGNOSIS — G40909 Epilepsy, unspecified, not intractable, without status epilepticus: Secondary | ICD-10-CM | POA: Diagnosis not present

## 2017-10-11 DIAGNOSIS — Z87891 Personal history of nicotine dependence: Secondary | ICD-10-CM | POA: Insufficient documentation

## 2017-10-11 DIAGNOSIS — R569 Unspecified convulsions: Secondary | ICD-10-CM

## 2017-10-11 LAB — BASIC METABOLIC PANEL
Anion gap: 22 — ABNORMAL HIGH (ref 5–15)
BUN: 9 mg/dL (ref 6–20)
CALCIUM: 9.6 mg/dL (ref 8.9–10.3)
CO2: 14 mmol/L — AB (ref 22–32)
Chloride: 106 mmol/L (ref 101–111)
Creatinine, Ser: 0.74 mg/dL (ref 0.44–1.00)
GFR calc Af Amer: >60 mL/min (ref >60)
GLUCOSE: 128 mg/dL — AB (ref 65–99)
Potassium: 3.2 mmol/L — ABNORMAL LOW (ref 3.5–5.1)
Sodium: 142 mmol/L (ref 135–145)

## 2017-10-11 LAB — CBC WITH DIFFERENTIAL/PLATELET
BASOS ABS: 0 10*3/uL (ref 0.0–0.1)
BASOS PCT: 0 %
EOS ABS: 0.1 10*3/uL (ref 0.0–0.7)
EOS PCT: 1 %
HEMATOCRIT: 40.3 % (ref 36.0–46.0)
Hemoglobin: 12.8 g/dL (ref 12.0–15.0)
Lymphocytes Relative: 64 %
Lymphs Abs: 5.7 10*3/uL — ABNORMAL HIGH (ref 0.7–4.0)
MCH: 30.8 pg (ref 26.0–34.0)
MCHC: 31.8 g/dL (ref 30.0–36.0)
MCV: 96.9 fL (ref 78.0–100.0)
MONO ABS: 0.9 10*3/uL (ref 0.1–1.0)
MONOS PCT: 10 %
Neutro Abs: 2.2 10*3/uL (ref 1.7–7.7)
Neutrophils Relative %: 25 %
PLATELETS: 223 10*3/uL (ref 150–400)
RBC: 4.16 MIL/uL (ref 3.87–5.11)
RDW: 15 % (ref 11.5–15.5)
WBC: 9 10*3/uL (ref 4.0–10.5)

## 2017-10-11 LAB — RAPID URINE DRUG SCREEN, HOSP PERFORMED
AMPHETAMINES: NOT DETECTED
Barbiturates: NOT DETECTED
Benzodiazepines: NOT DETECTED
Cocaine: NOT DETECTED
Opiates: NOT DETECTED
Tetrahydrocannabinol: POSITIVE — AB

## 2017-10-11 LAB — MAGNESIUM: MAGNESIUM: 2.1 mg/dL (ref 1.7–2.4)

## 2017-10-11 LAB — PHOSPHORUS: PHOSPHORUS: 3.7 mg/dL (ref 2.5–4.6)

## 2017-10-11 LAB — CBG MONITORING, ED: GLUCOSE-CAPILLARY: 77 mg/dL (ref 65–99)

## 2017-10-11 MED ORDER — LEVETIRACETAM IN NACL 500 MG/100ML IV SOLN
500.0000 mg | Freq: Once | INTRAVENOUS | Status: AC
  Administered 2017-10-11: 500 mg via INTRAVENOUS
  Filled 2017-10-11: qty 100

## 2017-10-11 NOTE — ED Triage Notes (Signed)
Patient BIB [facility] with sister who reports patient began seizing in the car. Patient post ictal on arrival. Sister reports hx of seizures.

## 2017-10-11 NOTE — Discharge Instructions (Signed)
Please take your medications as prescribed.  You will have to see your neurologist and inform them about not taking Lamictal, so that they can adjust your Keppra accordingly.

## 2017-10-11 NOTE — ED Notes (Signed)
Bed: WA18 Expected date:  Expected time:  Means of arrival:  Comments: 

## 2017-10-11 NOTE — ED Provider Notes (Signed)
[facility]-EMERGENCY DEPT Provider Note   CSN: [id number] Arrival date & time: 10/11/17  1427     History   Chief Complaint Chief Complaint  Patient presents with  . Seizures    HPI Meghan Williams is a 22 y.o. female.  HPI  22 year old female comes in a chief complaint of seizure.  Patient brought here by her sister.  According to patient's sister, patient had a 5-minute episode of seizure-like activity followed by confusion.  The seizure is described as contracture of the upper extremity with blank stare.  Patient reports that she was diagnosed with seizure disorder about 1-2 years ago.  Patient has been taking Keppra for the last 3 weeks only.  In general patient gets about 2-3 episodes of seizures every month.  Patient is also prescribed Lamictal, but she has not taken Lamictal.   Social history is positive for daily marijuana use, but patient has not used marijuana today.  Past Medical History:  Diagnosis Date  . Borderline hypertension   . GERD (gastroesophageal reflux disease)   . Hypertension   . Seizures [facility](HCC)     Patient Active Problem List   Diagnosis Date Noted  . High risk medication use 09/11/2017  . Convulsions (HCC) 05/02/2016    History reviewed. No pertinent surgical history.  OB History    No data available       Home Medications    Prior to Admission medications   Medication Sig Start Date End Date Taking? Authorizing Provider  levETIRAcetam (KEPPRA) 500 MG tablet Take 1 tablet (500 mg total) by mouth 2 (two) times daily. Patient taking differently: Take 250 mg by mouth 2 (two) times daily.  12/09/16  Yes [place] SpanielWillis, Charles K, MD  Multiple Vitamin (MULTIVITAMIN WITH MINERALS) TABS tablet Take 1 tablet by mouth daily.   Yes [provider]  folic acid (FOLVITE) 1 MG tablet Take 1 tablet (1 mg total) by mouth daily. Patient not taking: Reported on 10/11/2017 10/05/17   [place] SpanielWillis, Charles K, MD  lamoTRIgine (LAMICTAL) 25 MG  tablet Take 3 tablets (75 mg total) by mouth daily. Patient not taking: Reported on 10/11/2017 10/05/17   [place] SpanielWillis, Charles K, MD    Family History Family History  Problem Relation Age of Onset  . Hypertension Father   . Cervical cancer Maternal Grandmother   . Hypertension Maternal Grandmother   . Seizures Maternal Grandmother   . Seizures Maternal Grandfather     Social History Social History   Tobacco Use  . Smoking status: Former Games developermoker  . Smokeless tobacco: Never Used  Substance Use Topics  . Alcohol use: Yes    Alcohol/week: 1.2 oz    Types: 1 Cans of beer, 1 Shots of liquor per week    Comment: 1-2 drinks per week  . Drug use: Yes    Types: Marijuana     Allergies   Patient has no known allergies.   Review of Systems Review of Systems  Constitutional: Positive for activity change.  Respiratory: Negative for shortness of breath.   Cardiovascular: Negative for chest pain.  Gastrointestinal: Negative for abdominal pain.  Skin: Negative for rash and wound.  Neurological: Positive for seizures and headaches.  All other systems reviewed and are negative.    Physical Exam Updated Vital Signs BP (!) 134/99 (BP Location: Right Arm)   Pulse 63   Temp 98.3 F (36.8 C) (Oral)   Resp 19   SpO2 100%   Physical Exam  Constitutional: She is oriented  to person, place, and time. She appears well-developed.  HENT:  Head: Normocephalic and atraumatic.  Eyes: EOM are normal. Pupils are equal, round, and reactive to light.  Neck: Normal range of motion. Neck supple.  Cardiovascular: Normal rate.  Pulmonary/Chest: Effort normal.  Abdominal: Bowel sounds are normal.  Neurological: She is alert and oriented to person, place, and time. No cranial nerve deficit. Coordination normal.  Skin: Skin is warm and dry.  Nursing note and vitals reviewed.    ED Treatments / Results  Labs (all labs ordered are listed, but only abnormal results are displayed) Labs Reviewed    BASIC METABOLIC PANEL - Abnormal; Notable for the following components:      Result Value   Potassium 3.2 (*)    CO2 14 (*)    Glucose, Bld 128 (*)    Anion gap 22 (*)    All other components within normal limits  CBC WITH DIFFERENTIAL/PLATELET - Abnormal; Notable for the following components:   Lymphs Abs 5.7 (*)    All other components within normal limits  PHOSPHORUS  MAGNESIUM  RAPID URINE DRUG SCREEN, HOSP PERFORMED  CBG MONITORING, ED  I-STAT BETA HCG BLOOD, ED ([facility], [facility], [facility] ONLY)  CBG MONITORING, ED    EKG  EKG Interpretation None       Radiology No results found.  Procedures Procedures (including critical care time)  Medications Ordered in ED Medications  levETIRAcetam (KEPPRA) IVPB 500 mg/100 mL premix (0 mg Intravenous Stopped 10/11/17 1627)     Initial Impression / Assessment and Plan / ED Course  I have reviewed the triage vital signs and the nursing notes.  Pertinent labs & imaging results that were available during my care of the patient were reviewed by me and considered in my medical decision making (see chart for details).     DDx: -Seizure disorder -Meningitis -Trauma -ICH -Electrolyte abnormality -Metabolic derangement -Stroke -Toxin induced seizures -Medication side effects -Hypoxia -Hypoglycemia  22 year old female comes in with chief complaint of seizure-like activity.  Patient has history of seizure disorder and there has been some noncompliance with her medication.  Patient started taking Keppra 3 weeks ago, but she continues to not take Lamictal.  In general patient's seizure disorder is poorly controlled and she continues to have 2-3 episodes every month.  We will give patient an IV 500 mg Keppra load right now.  I will speak with [facility] to see if they would recommend any medication adjustment.  Final Clinical Impressions(s) / ED Diagnoses   Final diagnoses:  Seizure [facility])  Seizure disorder [facility])    ED Discharge  Orders    None       Derwood Kaplan, MD 10/11/17 1736

## 2017-10-30 ENCOUNTER — Telehealth: Payer: Self-pay | Admitting: Neurology

## 2017-10-30 MED ORDER — LEVETIRACETAM 500 MG PO TABS: 500.0000 mg | ORAL_TABLET | Freq: Two times a day (BID) | ORAL | 5 refills | Status: DC

## 2017-10-30 NOTE — Telephone Encounter (Signed)
Her mother called that she was almost out of the Keppra.  Initially when she took Keppra she had trouble tolerating it but is now able to tolerate it much better.  She had been unable to tolerate the lamotrigine so stayed on the Keppra.  Her current dose is 500 mg p.o. twice daily.  [facility] [place] civil [place]

## 2017-11-08 IMAGING — CT CT HEAD W/O CM
4 series · 19 of 47 positions shown, 21 images · non-contrast
Comparison: None.

CLINICAL DATA: New onset seizure.  Frontal headache.

EXAM:
CT HEAD WITHOUT CONTRAST
TECHNIQUE: Contiguous axial images were obtained from the base of the skull
through the vertex without intravenous contrast.

[Series 201: head w/o, idose (1) · axial · non-contrast · 0.42mm/px · z∈[+94,+209]mm · 8 of 31 slices shown, 10 images]
[im 4/31  brain]
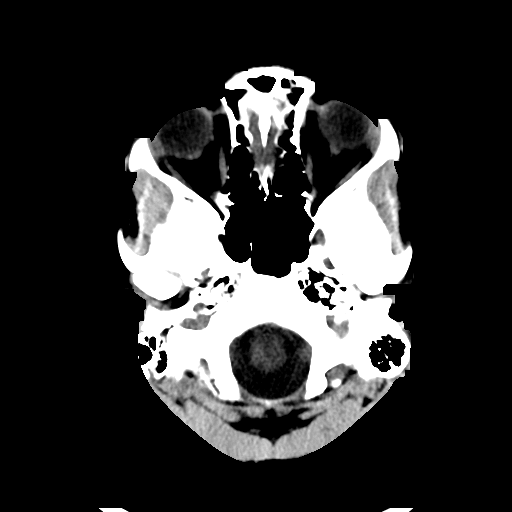
[im 4/31  bone]
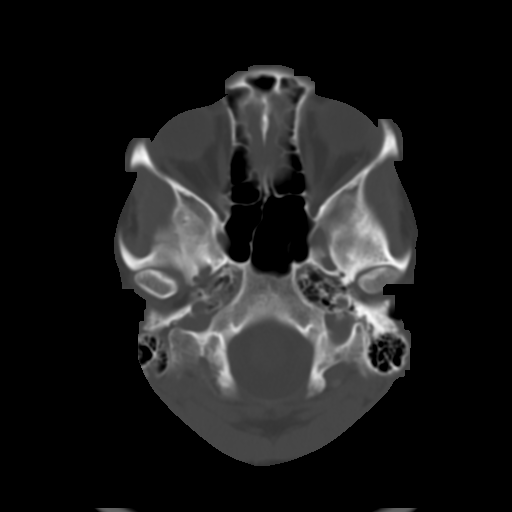
[im 7/31  brain]
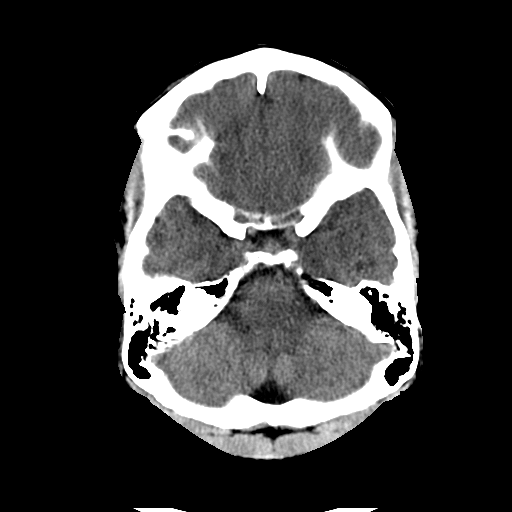
[im 11/31  brain]
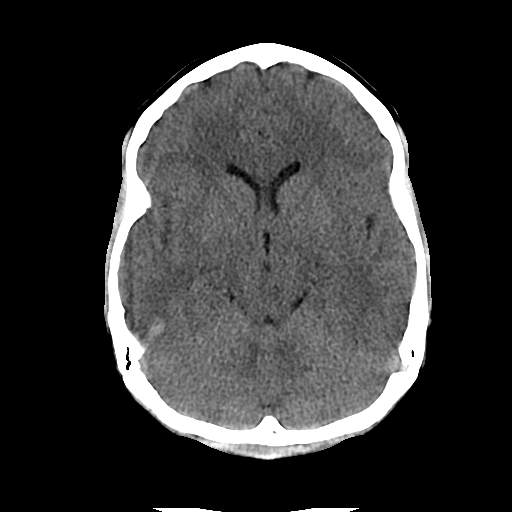
[im 14/31  brain]
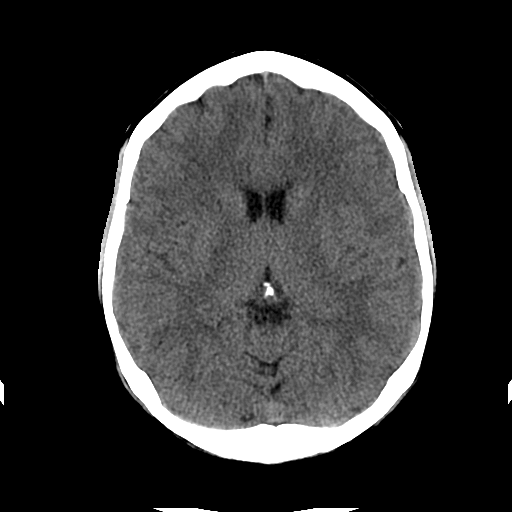
[im 17/31  brain]
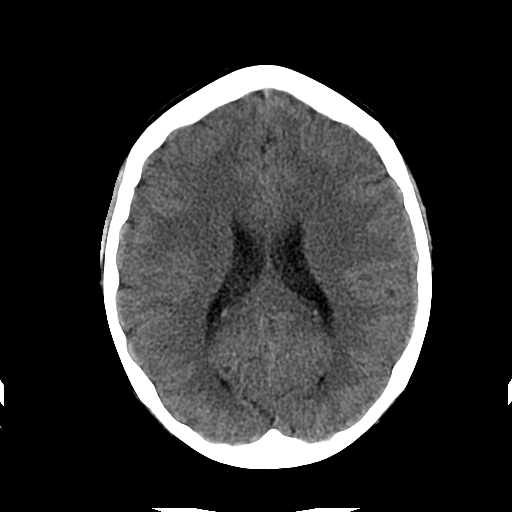
[im 17/31  bone]
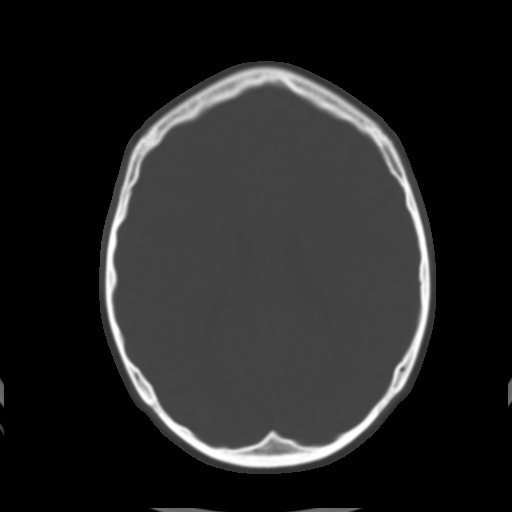
[im 21/31  brain]
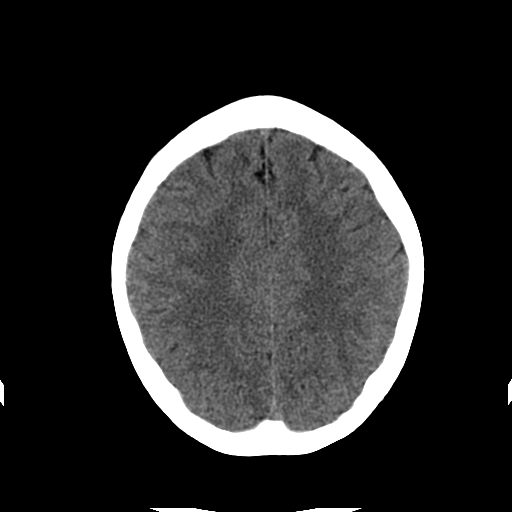
[im 24/31  brain]
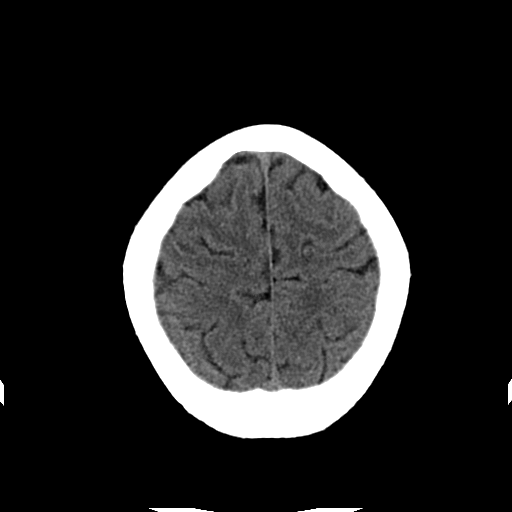
[im 27/31  brain]
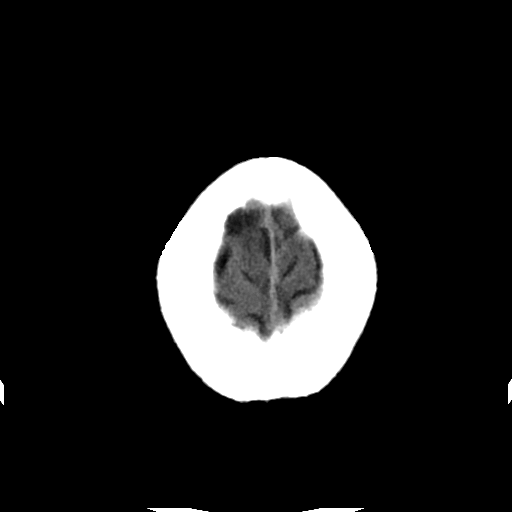

[Series 202: head w/o bone, idose (1) · axial · non-contrast · 0.42mm/px · z∈[+93,+165]mm · 5 of 62 slices shown]
[im 7/62  bone]
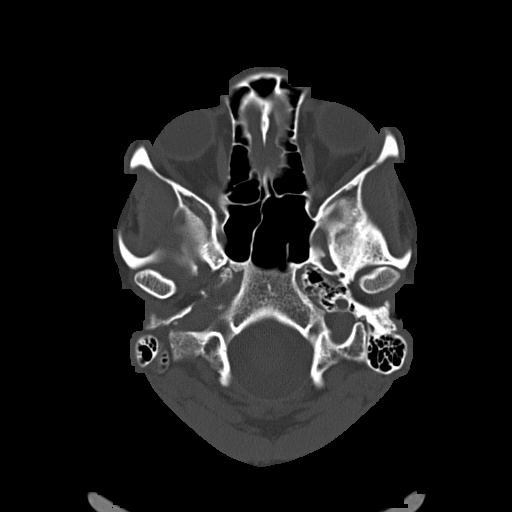
[im 13/62  bone]
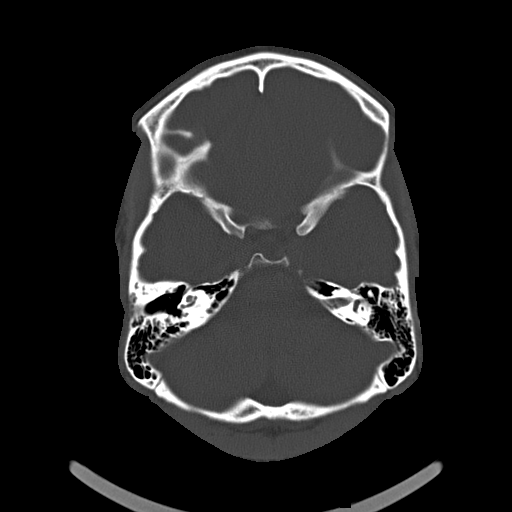
[im 20/62  bone]
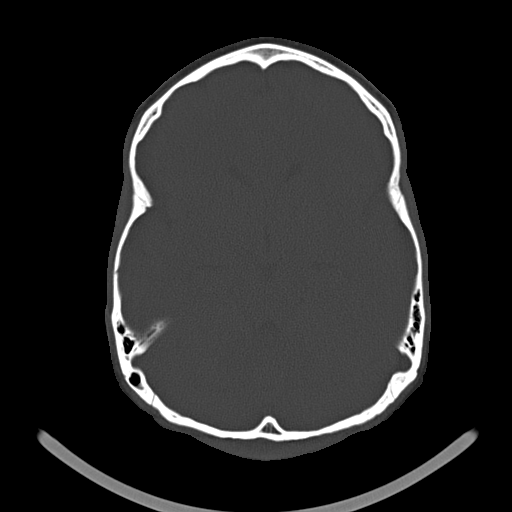
[im 26/62  bone]
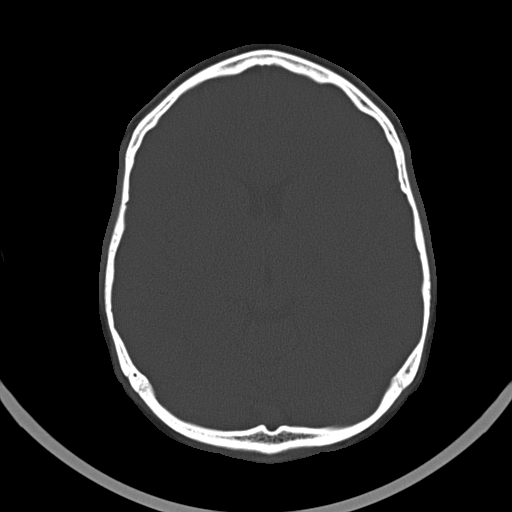
[im 36/62  bone]
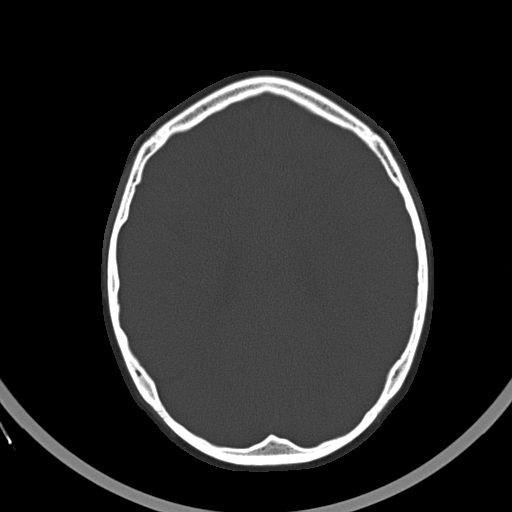

[Series 203: coronal st, idose (1) · coronal · 0.40mm/px · 3 of 65 slices shown]
[im 22/65  brain]
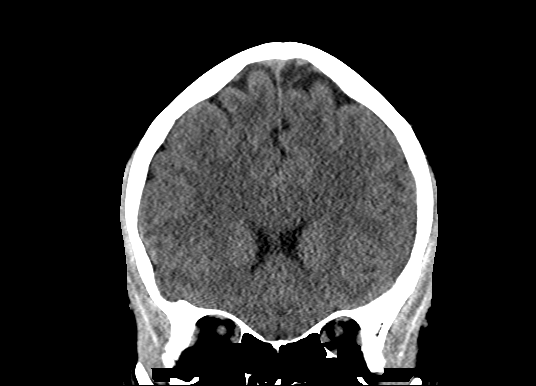
[im 29/65  brain]
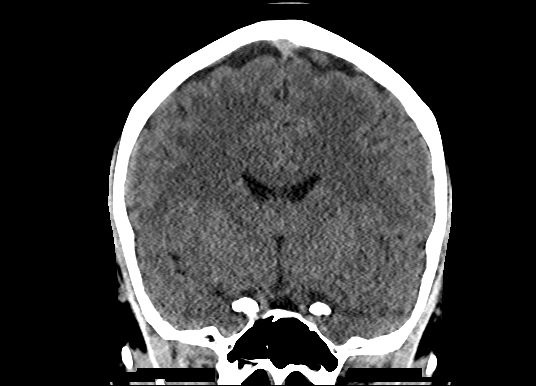
[im 36/65  brain]
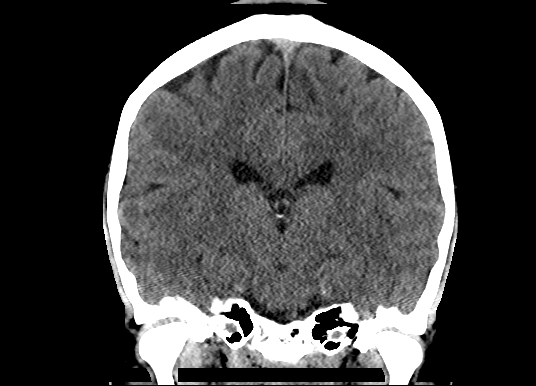

[Series 204: sagittal st, idose (1) · sagittal · 0.40mm/px · 3 of 66 slices shown]
[im 22/66  brain]
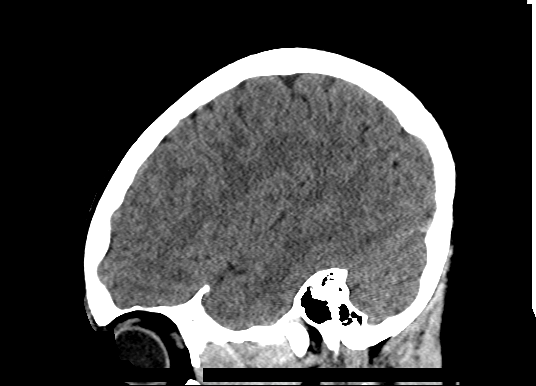
[im 33/66  brain]
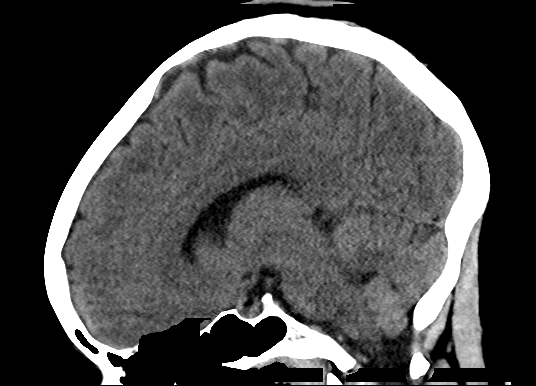
[im 44/66  brain]
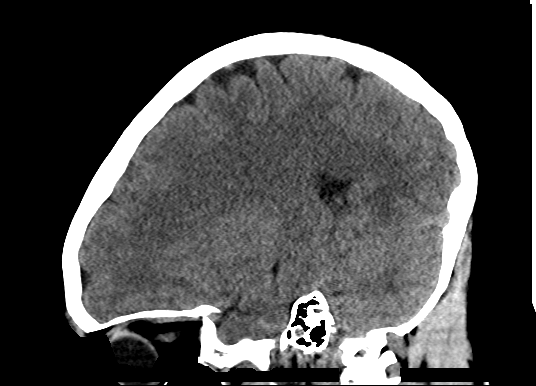

[19 of 47 positions shown; findings below may reference images not displayed]

FINDINGS: Brain: No mass lesion, intraparenchymal hemorrhage or extra-axial
collection. No evidence of acute cortical infarct. Brain parenchyma
and CSF-containing spaces are normal for age.

Vascular: No hyperdense vessel or unexpected calcification.

Skull: Normal visualized skull base, calvarium and extracranial soft
tissues.

Sinuses/Orbits: No sinus fluid levels or advanced mucosal
thickening. No mastoid effusion. Normal orbits.
IMPRESSION: Normal head CT.

## 2017-11-20 ENCOUNTER — Telehealth: Payer: Self-pay | Admitting: Family Medicine

## 2017-11-20 NOTE — Telephone Encounter (Signed)
Left message for pt to reschedule appt on 5/15. Provider is out of the office that afternoon.

## 2017-12-09 ENCOUNTER — Ambulatory Visit: Payer: PRIVATE HEALTH INSURANCE | Admitting: Family Medicine

## 2018-02-02 NOTE — Progress Notes (Deleted)
[facility]  PATIENT: Meghan Williams Section DOB: May 20, 1996   REASON FOR VISIT: *** HISTORY FROM:    HISTORY OF PRESENT ILLNESS: 3/11/19[name]. Eshbach is a 22 year old left-handed black female with a history of recurring seizure events.  The patient was in the emergency room on 07 September 2017 with a seizure.  The patient had been placed on Keppra when she was seen through this office in May 2018, but she never took the medication.  The patient is on a very low dose of Keppra taking 250 mg twice daily, she continues to have drowsiness and irritability on this drug.  She returns for an evaluation.  She is not operating a motor vehicle currently.    REVIEW OF SYSTEMS: Full 14 system review of systems performed and notable only for those listed, all others are neg:  Constitutional: neg  Cardiovascular: neg Ear/Nose/Throat: neg  Skin: neg Eyes: neg Respiratory: neg Gastroitestinal: neg  Hematology/Lymphatic: neg  Endocrine: neg Musculoskeletal:neg Allergy/Immunology: neg Neurological: neg Psychiatric: neg Sleep : neg   ALLERGIES: No Known Allergies  HOME MEDICATIONS: Outpatient Medications Prior to Visit  Medication Sig Dispense Refill  . folic acid (FOLVITE) 1 MG tablet Take 1 tablet (1 mg total) by mouth daily. (Patient not taking: Reported on 10/11/2017) 90 tablet 3  . levETIRAcetam (KEPPRA) 500 MG tablet Take 1 tablet (500 mg total) by mouth 2 (two) times daily. 60 tablet 5  . Multiple Vitamin (MULTIVITAMIN WITH MINERALS) TABS tablet Take 1 tablet by mouth daily.     No facility-administered medications prior to visit.     PAST MEDICAL HISTORY: Past Medical History:  Diagnosis Date  . Borderline hypertension   . GERD (gastroesophageal reflux disease)   . Hypertension   . Seizures (HCC)     PAST SURGICAL HISTORY: No past surgical history on file.  FAMILY HISTORY: Family History  Problem Relation Age of Onset  . Hypertension Father   . Cervical  cancer Maternal Grandmother   . Hypertension Maternal Grandmother   . Seizures Maternal Grandmother   . Seizures Maternal Grandfather     SOCIAL HISTORY: Social History   Socioeconomic History  . Marital status: Single    Spouse name: Not on file  . Number of children: 0  . Years of education: 47  . Highest education level: Not on file  Occupational History  . Occupation: Consulting civil engineer  Social Needs  . Financial resource strain: Not on file  . Food insecurity:    Worry: Not on file    Inability: Not on file  . Transportation needs:    Medical: Not on file    Non-medical: Not on file  Tobacco Use  . Smoking status: Former Games developer  . Smokeless tobacco: Never Used  Substance and Sexual Activity  . Alcohol use: Yes    Alcohol/week: 1.2 oz    Types: 1 Cans of beer, 1 Shots of liquor per week    Comment: 1-2 drinks per week  . Drug use: Yes    Types: Marijuana  . Sexual activity: Yes    Birth control/protection: None  Lifestyle  . Physical activity:    Days per week: Not on file    Minutes per session: Not on file  . Stress: Not on file  Relationships  . Social connections:    Talks on phone: Not on file    Gets together: Not on file    Attends religious service: Not on file    Active member of club or organization: Not  on file    Attends meetings of clubs or organizations: Not on file    Relationship status: Not on file  . Intimate partner violence:    Fear of current or ex partner: Not on file    Emotionally abused: Not on file    Physically abused: Not on file    Forced sexual activity: Not on file  Other Topics Concern  . Not on file  Social History Narrative   Resides w/ roommate in student living   Left-handed   Caffeine: none     PHYSICAL EXAM  There were no vitals filed for this visit. There is no height or weight on file to calculate BMI.  Generalized: Well developed, in no acute distress  Head: normocephalic and atraumatic,. Oropharynx benign  Neck:  Supple, no carotid bruits  Cardiac: Regular rate rhythm, no murmur  Musculoskeletal: No deformity   Neurological examination   Mentation: Alert oriented to time, place, history taking. Attention span and concentration appropriate. Recent and remote memory intact.  Follows all commands speech and language fluent.   Cranial nerve II-XII: Fundoscopic exam reveals sharp disc margins.Pupils were equal round reactive to light extraocular movements were full, visual field were full on confrontational test. Facial sensation and strength were normal. hearing was intact to finger rubbing bilaterally. Uvula tongue midline. head turning and shoulder shrug were normal and symmetric.Tongue protrusion into cheek strength was normal. Motor: normal bulk and tone, full strength in the BUE, BLE, fine finger movements normal, no pronator drift. No focal weakness Sensory: normal and symmetric to light touch, pinprick, and  Vibration, proprioception  Coordination: finger-nose-finger, heel-to-shin bilaterally, no dysmetria Reflexes: Brachioradialis 2/2, biceps 2/2, triceps 2/2, patellar 2/2, Achilles 2/2, plantar responses were flexor bilaterally. Gait and Station: Rising up from seated position without assistance, normal stance,  moderate stride, good arm swing, smooth turning, able to perform tiptoe, and heel walking without difficulty. Tandem gait is steady  DIAGNOSTIC DATA (LABS, IMAGING, TESTING) - I reviewed patient records, labs, notes, testing and imaging myself where available.  Lab Results  Component Value Date   WBC 9.0 10/11/2017   HGB 12.8 10/11/2017   HCT 40.3 10/11/2017   MCV 96.9 10/11/2017   PLT 223 10/11/2017      Component Value Date/Time   NA 142 10/11/2017 1517   K 3.2 (L) 10/11/2017 1517   CL 106 10/11/2017 1517   CO2 14 (L) 10/11/2017 1517   GLUCOSE 128 (H) 10/11/2017 1517   BUN 9 10/11/2017 1517   CREATININE 0.74 10/11/2017 1517   CALCIUM 9.6 10/11/2017 1517   PROT 6.2 09/11/2017  1216   ALBUMIN 4.0 09/11/2017 1216   AST 20 09/11/2017 1216   ALT 6 09/11/2017 1216   ALKPHOS 58 09/11/2017 1216   BILITOT 0.3 09/11/2017 1216   GFRNONAA >60 10/11/2017 1517   GFRAA >60 10/11/2017 1517   No results found for: CHOL, HDL, LDLCALC, LDLDIRECT, TRIG, CHOLHDL No results found for: ZOXW9U No results found for: VITAMINB12 No results found for: TSH  ***  ASSESSMENT AND PLAN  22 y.o. year old female  has a past medical history of Borderline hypertension, GERD (gastroesophageal reflux disease), Hypertension, and Seizures (HCC). here with ***   History of seizures  The patient is not to operate a motor vehicle for 6 months following the last seizure.  She is not tolerating the Keppra well, she will stay on the current dose for now, she will be placed on Lamictal, working up by 50 mg every  2 weeks until she is at 75 mg twice daily.  At that point, they will call our office, we will convert her to the 100 mg Lamictal tablets taking 1 twice daily and initiate a taper off of the Keppra.  She will follow-up in 4 months.    Nilda RiggsNancy Carolyn Kelbie Moro, [facility] Healthcare SystemGNP, [facility] HospitalBC, APRN  [facility] [address], [address] [place], [place] [address] [phone]

## 2018-02-04 ENCOUNTER — Telehealth: Payer: Self-pay | Admitting: *Deleted

## 2018-02-04 ENCOUNTER — Ambulatory Visit: Payer: PRIVATE HEALTH INSURANCE | Admitting: Nurse Practitioner

## 2018-02-04 NOTE — Telephone Encounter (Signed)
Patient was no show for follow up with NP today.  

## 2018-02-08 ENCOUNTER — Encounter: Payer: Self-pay | Admitting: Nurse Practitioner

## 2018-03-01 ENCOUNTER — Telehealth: Payer: Self-pay | Admitting: Neurology

## 2018-03-01 MED ORDER — LEVETIRACETAM 500 MG PO TABS: ORAL_TABLET | ORAL | 1 refills | Status: DC

## 2018-03-01 MED ORDER — LEVETIRACETAM 500 MG PO TABS: ORAL_TABLET | ORAL | 0 refills | Status: DC

## 2018-03-01 NOTE — Telephone Encounter (Signed)
Pts mother Meghan Williams requesting the RX for levETIRAcetam (KEPPRA) 500 MG [facility] [address]

## 2018-03-01 NOTE — Telephone Encounter (Signed)
E-scribed 90 days supply as requested. 

## 2018-03-01 NOTE — Telephone Encounter (Signed)
E-scribed refill 

## 2018-03-01 NOTE — Addendum Note (Signed)
Addended by: Hillis RangeKING, EMMA L on: 03/01/2018 04:08 PM   Modules accepted: Orders

## 2018-03-01 NOTE — Telephone Encounter (Signed)
[id number] [place], [place] - [address] has called to inform that in order for the insurance to cover pt's levETIRAcetam (KEPPRA) 500 MG tablet it needs to be for a 90 day.  Please call pharmacy [phone]

## 2018-05-29 ENCOUNTER — Other Ambulatory Visit: Payer: Self-pay | Admitting: Neurology

## 2018-06-15 NOTE — Progress Notes (Signed)
[facility]  PATIENT: Meghan Williams DOB: 22-Jan-1996   REASON FOR VISIT: Follow-up for seizure disorder HISTORY FROM: Patient    HISTORY OF PRESENT ILLNESS:UPDATE 11/20/2019CM Meghan Williams, 22 year old female Returns for follow-up with history of seizure disorder.  She is currently on Keppra 500 mg twice daily.  She denies any agitation to the medication.  Last seizure was November 06, 2017.  She was seen in the emergency room .  She denies missed doses of her medication.  She is currently not operating a motor vehicle.  She returns for reevaluation  3 11/19 [name]. Meghan Williams is a 22 year old left-handed black female with a history of recurring seizure events.  The patient was in the emergency room on 07 September 2017 with a seizure.  The patient had been placed on Keppra when she was seen through this office in May 2018, but she never took the medication.  The patient is on a very low dose of Keppra taking 250 mg twice daily, she continues to have drowsiness and irritability on this drug.  She returns for an evaluation.  She is not operating a motor vehicle currently. REVIEW OF SYSTEMS: Full 14 system review of systems performed and notable only for those listed, all others are neg:  Constitutional: neg  Cardiovascular: neg Ear/Nose/Throat: neg  Skin: neg Eyes: neg Respiratory: neg Gastroitestinal: neg  Hematology/Lymphatic: neg  Endocrine: neg Musculoskeletal:neg Allergy/Immunology: neg Neurological: Seizure disorder Psychiatric: neg Sleep : neg   ALLERGIES: No Known Allergies  HOME MEDICATIONS: Outpatient Medications Prior to Visit  Medication Sig Dispense Refill  . levETIRAcetam (KEPPRA) 500 MG tablet 1 tablet by mouth twice daily. Call to schedule follow up at [phone] to receive any future refills 180 tablet 0  . Multiple Vitamin (MULTIVITAMIN WITH MINERALS) TABS tablet Take 1 tablet by mouth daily.    . folic acid (FOLVITE) 1 MG tablet Take 1 tablet (1 mg  total) by mouth daily. (Patient not taking: Reported on 10/11/2017) 90 tablet 3   No facility-administered medications prior to visit.     PAST MEDICAL HISTORY: Past Medical History:  Diagnosis Date  . Borderline hypertension   . GERD (gastroesophageal reflux disease)   . Hypertension   . Seizures (HCC)     PAST SURGICAL HISTORY: History reviewed. No pertinent surgical history.  FAMILY HISTORY: Family History  Problem Relation Age of Onset  . Hypertension Father   . Cervical cancer Maternal Grandmother   . Hypertension Maternal Grandmother   . Seizures Maternal Grandmother   . Seizures Maternal Grandfather     SOCIAL HISTORY: Social History   Socioeconomic History  . Marital status: Single    Spouse name: Not on file  . Number of children: 0  . Years of education: 9  . Highest education level: Not on file  Occupational History    Comment: graduated from college  Social Needs  . Financial resource strain: Not on file  . Food insecurity:    Worry: Not on file    Inability: Not on file  . Transportation needs:    Medical: Not on file    Non-medical: Not on file  Tobacco Use  . Smoking status: Former Games developer  . Smokeless tobacco: Never Used  Substance and Sexual Activity  . Alcohol use: Yes    Alcohol/week: 2.0 standard drinks    Types: 1 Cans of beer, 1 Shots of liquor per week    Comment: 1-2 drinks per week  . Drug use: Yes    Types:  Marijuana  . Sexual activity: Yes    Birth control/protection: None  Lifestyle  . Physical activity:    Days per week: Not on file    Minutes per session: Not on file  . Stress: Not on file  Relationships  . Social connections:    Talks on phone: Not on file    Gets together: Not on file    Attends religious service: Not on file    Active member of club or organization: Not on file    Attends meetings of clubs or organizations: Not on file    Relationship status: Not on file  . Intimate partner violence:    Fear of  current or ex partner: Not on file    Emotionally abused: Not on file    Physically abused: Not on file    Forced sexual activity: Not on file  Other Topics Concern  . Not on file  Social History Narrative   Resides w/ roommate in student living   Left-handed   Caffeine: none     PHYSICAL EXAM  Vitals:   06/16/18 0807  BP: 136/84  Pulse: 64  Weight: 118 lb 3.2 oz (53.6 kg)  Height: 5\' 9"  (1.753 m)   Body mass index is 17.46 kg/m.  Generalized: Well developed, in no acute distress  Head: normocephalic and atraumatic,. Oropharynx benign  Neck: Supple,  Musculoskeletal: No deformity  Skin no rash or edema Neurological examination   Mentation: Alert oriented to time, place, history taking. Attention span and concentration appropriate. Recent and remote memory intact.  Follows all commands speech and language fluent.   Cranial nerve II-XII: Pupils were equal round reactive to light extraocular movements were full, visual field were full on confrontational test. Facial sensation and strength were normal. hearing was intact to finger rubbing bilaterally. Uvula tongue midline. head turning and shoulder shrug were normal and symmetric.Tongue protrusion into cheek strength was normal. Motor: normal bulk and tone, full strength in the BUE, BLE,  Sensory: normal and symmetric to light touch, on the face arms and legs Coordination: finger-nose-finger, heel-to-shin bilaterally, no dysmetria Reflexes: Brachioradialis 2/2, biceps 2/2, triceps 2/2, patellar 2/2, Achilles 2/2, plantar responses were flexor bilaterally. Gait and Station: Rising up from seated position without assistance, normal stance,  moderate stride, good arm swing, smooth turning, able to perform tiptoe, and heel walking without difficulty. Tandem gait is steady  DIAGNOSTIC DATA (LABS, IMAGING, TESTING) - I reviewed patient records, labs, notes, testing and imaging myself where available.  Lab Results  Component Value  Date   WBC 9.0 10/11/2017   HGB 12.8 10/11/2017   HCT 40.3 10/11/2017   MCV 96.9 10/11/2017   PLT 223 10/11/2017      Component Value Date/Time   NA 142 10/11/2017 1517   K 3.2 (L) 10/11/2017 1517   CL 106 10/11/2017 1517   CO2 14 (L) 10/11/2017 1517   GLUCOSE 128 (H) 10/11/2017 1517   BUN 9 10/11/2017 1517   CREATININE 0.74 10/11/2017 1517   CALCIUM 9.6 10/11/2017 1517   PROT 6.2 09/11/2017 1216   ALBUMIN 4.0 09/11/2017 1216   AST 20 09/11/2017 1216   ALT 6 09/11/2017 1216   ALKPHOS 58 09/11/2017 1216   BILITOT 0.3 09/11/2017 1216   GFRNONAA >60 10/11/2017 1517   GFRAA >60 10/11/2017 1517   ASSESSMENT AND PLAN  22 y.o. year old female  has a past medical history of Borderline hypertension, GERD (gastroesophageal reflux disease), Hypertension, and Seizures (HCC). here to follow-up for  her seizure disorder.  Last seizure occurred November 08, 2017   Continue Keppra 500 mg twice daily will refill Continue folic acid 1 mg by mouth daily Call for further seizure activity Please remember, common seizure triggers are: Sleep deprivation, dehydration, overheating, stress, hypoglycemia or skipping meals, certain medications or excessive alcohol use, especially stopping alcohol abruptly if you have had heavy alcohol use before (aka alcohol withdrawal seizure). If you have a prolonged seizure over 2-5 minutes or back to back seizures, call or have someone call 911 or take you to the nearest emergency room. You cannot drive a car or operate any other machinery or vehicle within 6 months of a seizure. Please do not swim alone  for safety. Take your medicine for seizure prevention regularly and do not skip doses or stop medication abruptly and tone are told to do so by your healthcare provider. Try to get a refill on your antiepileptic medication ahead of time, so you are not at risk of running out. If you run out of the seizure medication and do not have a refill at hand she may run into medication  withdrawal seizures. Avoid taking Wellbutrin, narcotic pain medications and tramadol, as they can lower seizure threshold.  F/U in 6 months I spent 20in total face to face time with the patient more than 50% of which was spent counseling and coordination of care, reviewing test results reviewing medications and discussing and reviewing the diagnosis of seizure disorder and common seizure triggers and further treatment options. , Cline CrockNancy Carolyn Janellie Tennison, GNP, [facility], APRN  [facility] [address], [address] [place], [place] [address] [phone]

## 2018-06-16 ENCOUNTER — Encounter: Payer: Self-pay | Admitting: Nurse Practitioner

## 2018-06-16 ENCOUNTER — Ambulatory Visit (INDEPENDENT_AMBULATORY_CARE_PROVIDER_SITE_OTHER): Payer: PRIVATE HEALTH INSURANCE | Admitting: Nurse Practitioner

## 2018-06-16 VITALS — BP 136/84 | HR 64 | Ht 69.0 in | Wt 118.2 lb

## 2018-06-16 DIAGNOSIS — R569 Unspecified convulsions: Secondary | ICD-10-CM

## 2018-06-16 MED ORDER — LEVETIRACETAM 500 MG PO TABS: ORAL_TABLET | ORAL | 2 refills | Status: DC

## 2018-06-16 NOTE — Progress Notes (Signed)
I have read the note, and I agree with the clinical assessment and plan.  Meghan Williams   

## 2018-06-16 NOTE — Patient Instructions (Signed)
Continue Keppra 500 mg twice daily Continue folic acid 1 mg by mouth daily Call for further seizure activity Please remember, common seizure triggers are: Sleep deprivation, dehydration, overheating, stress, hypoglycemia or skipping meals, certain medications or excessive alcohol use, especially stopping alcohol abruptly if you have had heavy alcohol use before (aka alcohol withdrawal seizure). If you have a prolonged seizure over 2-5 minutes or back to back seizures, call or have someone call 911 or take you to the nearest emergency room. You cannot drive a car or operate any other machinery or vehicle within 6 months of a seizure. Please do not swim alone  for safety. Take your medicine for seizure prevention regularly and do not skip doses or stop medication abruptly and tone are told to do so by your healthcare provider. Try to get a refill on your antiepileptic medication ahead of time, so you are not at risk of running out. If you run out of the seizure medication and do not have a refill at hand she may run into medication withdrawal seizures. Avoid taking Wellbutrin, narcotic pain medications and tramadol, as they can lower seizure threshold.  F/U in 6 months

## 2018-11-12 ENCOUNTER — Telehealth: Payer: Self-pay | Admitting: Family Medicine

## 2018-11-12 NOTE — Telephone Encounter (Signed)
Called and talked to patient mom (on DPR) and patient does not want to schedule virtual visit.

## 2018-11-23 ENCOUNTER — Telehealth: Payer: Self-pay | Admitting: Family Medicine

## 2018-11-23 NOTE — Telephone Encounter (Signed)
I called and left message on patient voicemail to call office and schedule follow up appointment with Dr. Kremer.  °

## 2019-03-03 ENCOUNTER — Other Ambulatory Visit: Payer: Self-pay | Admitting: *Deleted

## 2019-03-03 MED ORDER — LEVETIRACETAM 500 MG PO TABS: ORAL_TABLET | ORAL | 0 refills | Status: DC

## 2019-03-08 ENCOUNTER — Other Ambulatory Visit: Payer: Self-pay | Admitting: Neurology

## 2019-03-08 MED ORDER — LEVETIRACETAM 500 MG PO TABS: ORAL_TABLET | ORAL | 0 refills | Status: DC

## 2019-03-16 ENCOUNTER — Telehealth: Payer: Self-pay | Admitting: Nurse Practitioner

## 2019-03-16 ENCOUNTER — Telehealth: Payer: Self-pay | Admitting: Neurology

## 2019-03-16 MED ORDER — LEVETIRACETAM 500 MG PO TABS: ORAL_TABLET | ORAL | 0 refills | Status: DC

## 2019-03-16 NOTE — Telephone Encounter (Signed)
Pt mother(on DPR) has called asking if pt's next appointment can be a virtual or if provider needs her to come into the office.  Mother states pt only has enough medication until Friday.  Please call

## 2019-03-16 NOTE — Telephone Encounter (Signed)
Patient's mother called after hours call line at 4:37 PM today requesting a 90-day prescription per insurance requirement.  Keppra prescription changed to 90 days.

## 2019-03-16 NOTE — Telephone Encounter (Signed)
I spoke with the pt. She prefers a VV. Pt understands that although there may be some limitations with this type of visit, we will take all precautions to reduce any security or privacy concerns.  Pt understands that this will be treated like an in office visit and we will file with pt's insurance, and there may be a patient responsible charge related to this service. She was scheduled with Judson Roch NP on Monday 9/14 @ 1:45 pm for a mychart video visit. Pt provided her phone number and I sent the link to her phone through epic so she can setup mychart. She understands to go ahead and this soon so she can familiarize. She also understands that if she uses her phone for the visit she would need to download the app for the appointment. She understands the process to check-in to her appt the day of the appt and the nurse will call her to update her chart when it gets closer to the visit. Pt aware that a refill of keppra was sent to [facility] on 03/07/2019. She verbalized appreciation.    *balance per chart, confirmed with Debra in billing, pt had paid toward balance today. Ok to schedule.

## 2019-04-11 ENCOUNTER — Telehealth: Payer: Self-pay | Admitting: Neurology

## 2019-04-11 NOTE — Progress Notes (Deleted)
    Virtual Visit via Video Note  I connected with Meghan Williams on 04/11/19 at  1:45 PM EDT by a video enabled telemedicine application and verified that I am speaking with the correct person using two identifiers.  Location: Patient: *** Provider: ***   I discussed the limitations of evaluation and management by telemedicine and the availability of in person appointments. The patient expressed understanding and agreed to proceed.  History of Present Illness: 04/11/2019 SS:   11/20/2019CM Meghan Williams, 23 year old female Returns for follow-up with history of seizure disorder.  She is currently on Keppra 500 mg twice daily.  She denies any agitation to the medication.  Last seizure was November 06, 2017.  She was seen in the emergency room .  She denies missed doses of her medication.  She is currently not operating a motor vehicle.  She returns for reevaluation    Observations/Objective:   Assessment and Plan:   Follow Up Instructions:    I discussed the assessment and treatment plan with the patient. The patient was provided an opportunity to ask questions and all were answered. The patient agreed with the plan and demonstrated an understanding of the instructions.   The patient was advised to call back or seek an in-person evaluation if the symptoms worsen or if the condition fails to improve as anticipated.  I provided *** minutes of non-face-to-face time during this encounter.   Suzzanne Cloud, NP

## 2019-06-07 ENCOUNTER — Other Ambulatory Visit: Payer: Self-pay | Admitting: Neurology

## 2019-06-07 NOTE — Telephone Encounter (Signed)
I called pt to schedule her appt. She is overdue. No answer, left a message asking her to call me back.

## 2019-06-08 NOTE — Telephone Encounter (Signed)
I called pt again. No answer, left a message asking her to call us back. Please assist pt with scheduling appt if she calls back.

## 2019-06-15 NOTE — Progress Notes (Signed)
PATIENT: Meghan Williams DOB: 01-10-1996  REASON FOR VISIT: follow up HISTORY FROM: patient  HISTORY OF PRESENT ILLNESS: Today 06/16/19  Meghan Williams is a 23 year old female with history of seizure disorder. She remains on Keppra 500 mg twice a day.  Her last seizure occurred in August 2020.  She indicates she did not miss any doses of her Keppra at that time.  She says she was talking to her mom, she fell to the ground and had a full body seizure.  She did not go to the hospital, because she came around quickly. She cannot identify any triggers.  Prior to the seizure, her last seizure occurred in April 2019, in the setting of missed doses of Keppra.  She works full-time at a [employer].  She does not drive a car.  She indicates overall she has been doing well.  She lives with her mom.  Her blood pressure is elevated today.  Previously, EEG has been normal.  MRI of the brain was normal in 2018.  She says she is tolerating Keppra without side effect.  She presents today for follow-up unaccompanied.  HISTORY  HISTORY OF PRESENT ILLNESS:UPDATE 11/20/2019CM Meghan Williams, 23 year old female Returns for follow-up with history of seizure disorder.  She is currently on Keppra 500 mg twice daily.  She denies any agitation to the medication.  Last seizure was November 06, 2017.  She was seen in the emergency room .  She denies missed doses of her medication.  She is currently not operating a motor vehicle.  She returns for reevaluation  REVIEW OF SYSTEMS: Out of a complete 14 system review of symptoms, the patient complains only of the following symptoms, and all other reviewed systems are negative.  Seizures  ALLERGIES: No Known Allergies  HOME MEDICATIONS: Outpatient Medications Prior to Visit  Medication Sig Dispense Refill  . levETIRAcetam (KEPPRA) 500 MG tablet 1 tablet by mouth twice daily. 180 tablet 0  . Multiple Vitamin (MULTIVITAMIN WITH MINERALS) TABS tablet Take 1 tablet by mouth daily.    .  folic acid (FOLVITE) 1 MG tablet Take 1 tablet (1 mg total) by mouth daily. (Patient not taking: Reported on 06/16/2019) 90 tablet 3   No facility-administered medications prior to visit.     PAST MEDICAL HISTORY: Past Medical History:  Diagnosis Date  . Borderline hypertension   . GERD (gastroesophageal reflux disease)   . Hypertension   . Seizures ([place])     PAST SURGICAL HISTORY: No past surgical history on file.  FAMILY HISTORY: Family History  Problem Relation Age of Onset  . Hypertension Father   . Cervical cancer Maternal Grandmother   . Hypertension Maternal Grandmother   . Seizures Maternal Grandmother   . Seizures Maternal Grandfather     SOCIAL HISTORY: Social History   Socioeconomic History  . Marital status: Single    Spouse name: Not on file  . Number of children: 0  . Years of education: 75  . Highest education level: Not on file  Occupational History    Comment: graduated from [employer]  . Financial resource strain: Not on file  . Food insecurity    Worry: Not on file    Inability: Not on file  . Transportation needs    Medical: Not on file    Non-medical: Not on file  Tobacco Use  . Smoking status: Former Research scientist (life sciences)  . Smokeless tobacco: Never Used  Substance and Sexual Activity  . Alcohol use: Yes  Alcohol/week: 2.0 standard drinks    Types: 1 Cans of beer, 1 Shots of liquor per week    Comment: 1-2 drinks per week  . Drug use: Yes    Types: Marijuana  . Sexual activity: Yes    Birth control/protection: None  Lifestyle  . Physical activity    Days per week: Not on file    Minutes per session: Not on file  . Stress: Not on file  Relationships  . Social Musicianconnections    Talks on phone: Not on file    Gets together: Not on file    Attends religious service: Not on file    Active member of club or organization: Not on file    Attends meetings of clubs or organizations: Not on file    Relationship status: Not on file  .  Intimate partner violence    Fear of current or ex partner: Not on file    Emotionally abused: Not on file    Physically abused: Not on file    Forced sexual activity: Not on file  Other Topics Concern  . Not on file  Social History Narrative   Resides w/ roommate in student living   Left-handed   Caffeine: none   PHYSICAL EXAM  Vitals:   06/16/19 1300 06/16/19 1305  BP: (!) 163/117 (!) 152/114  Pulse: 66   Temp: 98.2 F (36.8 C)   Weight: 120 lb 6.4 oz (54.6 kg)   Height: 5\' 9"  (1.753 m)    Body mass index is 17.78 kg/m.  Generalized: Well developed, in no acute distress   Neurological examination  Mentation: Alert oriented to time, place, history taking. Follows all commands speech and language fluent Cranial nerve II-XII: Pupils were equal round reactive to light. Extraocular movements were full, visual field were full on confrontational test. Facial sensation and strength were normal.  Head turning and shoulder shrug  were normal and symmetric. Motor: The motor testing reveals 5 over 5 strength of all 4 extremities. Good symmetric motor tone is noted throughout.  Sensory: Sensory testing is intact to soft touch on all 4 extremities. No evidence of extinction is noted.  Coordination: Cerebellar testing reveals good finger-nose-finger and heel-to-shin bilaterally.  Gait and station: Gait is normal. Tandem gait is normal. Romberg is negative. No drift is seen.  Reflexes: Deep tendon reflexes are symmetric and normal bilaterally.   DIAGNOSTIC DATA (LABS, IMAGING, TESTING) - I reviewed patient records, labs, notes, testing and imaging myself where available.  Lab Results  Component Value Date   WBC 9.0 10/11/2017   HGB 12.8 10/11/2017   HCT 40.3 10/11/2017   MCV 96.9 10/11/2017   PLT 223 10/11/2017      Component Value Date/Time   NA 142 10/11/2017 1517   K 3.2 (L) 10/11/2017 1517   CL 106 10/11/2017 1517   CO2 14 (L) 10/11/2017 1517   GLUCOSE 128 (H) 10/11/2017  1517   BUN 9 10/11/2017 1517   CREATININE 0.74 10/11/2017 1517   CALCIUM 9.6 10/11/2017 1517   PROT 6.2 09/11/2017 1216   ALBUMIN 4.0 09/11/2017 1216   AST 20 09/11/2017 1216   ALT 6 09/11/2017 1216   ALKPHOS 58 09/11/2017 1216   BILITOT 0.3 09/11/2017 1216   GFRNONAA >60 10/11/2017 1517   GFRAA >60 10/11/2017 1517   No results found for: CHOL, HDL, LDLCALC, LDLDIRECT, TRIG, CHOLHDL No results found for: ZOXW9UHGBA1C No results found for: VITAMINB12 No results found for: TSH    ASSESSMENT AND  PLAN 23 y.o. year old female  has a past medical history of Borderline hypertension, GERD (gastroesophageal reflux disease), Hypertension, and Seizures (HCC). here with:  1.  Seizures  She had a seizure in August 2020.  She indicates she had not missed any doses of Keppra.  Due to the recent seizure, I will increase her Keppra to 750 mg twice a day.  I will send in a prescription for folic acid 1 mg daily.  She is not to operate a motor vehicle until she is seizure-free for 6 months.  She will call for recurrent seizure.  Her blood pressure is elevated today.  I have asked her to follow-up with her primary doctor.  I spent 15 minutes with the patient. 50% of this time was spent discussing her plan of care.  Margie Ege, AGNP-C, DNP 06/16/2019, 1:19 PM [facility] [address], [address] [place], [place] [address] [phone]

## 2019-06-16 ENCOUNTER — Ambulatory Visit: Payer: PRIVATE HEALTH INSURANCE | Admitting: Neurology

## 2019-06-16 ENCOUNTER — Other Ambulatory Visit: Payer: Self-pay

## 2019-06-16 ENCOUNTER — Encounter: Payer: Self-pay | Admitting: Neurology

## 2019-06-16 VITALS — BP 152/114 | HR 66 | Temp 98.2°F | Ht 69.0 in | Wt 120.4 lb

## 2019-06-16 DIAGNOSIS — R569 Unspecified convulsions: Secondary | ICD-10-CM | POA: Diagnosis not present

## 2019-06-16 MED ORDER — LEVETIRACETAM 750 MG PO TABS: ORAL_TABLET | ORAL | 1 refills | Status: DC

## 2019-06-16 MED ORDER — FOLIC ACID 1 MG PO TABS: 1.0000 mg | ORAL_TABLET | Freq: Every day | ORAL | 3 refills | Status: DC

## 2019-06-16 NOTE — Patient Instructions (Signed)
I will increase your Keppra 750 mg twice a day, continue Folic Acid 1 mg daily. Call for recurrent seizure. No driving for 6 months from last seizure.

## 2019-06-16 NOTE — Progress Notes (Signed)
I have read the note, and I agree with the clinical assessment and plan.  Charles K Willis   

## 2019-12-12 ENCOUNTER — Other Ambulatory Visit: Payer: Self-pay | Admitting: Neurology

## 2019-12-14 ENCOUNTER — Other Ambulatory Visit: Payer: Self-pay

## 2019-12-14 ENCOUNTER — Ambulatory Visit: Payer: Self-pay | Admitting: Neurology

## 2019-12-14 ENCOUNTER — Encounter: Payer: Self-pay | Admitting: Neurology

## 2019-12-14 VITALS — BP 146/98 | HR 70 | Ht 69.0 in | Wt 117.0 lb

## 2019-12-14 DIAGNOSIS — R569 Unspecified convulsions: Secondary | ICD-10-CM

## 2019-12-14 MED ORDER — FOLIC ACID 1 MG PO TABS: 1.0000 mg | ORAL_TABLET | Freq: Every day | ORAL | 11 refills | Status: DC

## 2019-12-14 MED ORDER — LEVETIRACETAM 750 MG PO TABS: ORAL_TABLET | ORAL | 11 refills | Status: DC

## 2019-12-14 NOTE — Progress Notes (Signed)
PATIENT: Meghan Williams DOB: 08-May-1996  REASON FOR VISIT: follow up HISTORY FROM: patient  HISTORY OF PRESENT ILLNESS: Today 12/14/19  Meghan Williams 24 year old female with history of seizure disorder.  Her last seizure was in August 2020, was compliant with Keppra at the time.  She remains on Keppra 750 mg twice a day, and folic acid 1 mg daily.  She is overall doing well, no recurrent seizure.  Unfortunately, her father passed away with Covid complications, he was [employer] Emergency planning/management officer, she has lost her health insurance.  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 She doesn't drive a car, she Uber's everywhere.  Denies any problems or concerns.  Presents today for evaluation unaccompanied.  HISTORY 06/16/2019 SS: Meghan Williams is a 24 year old female with history of seizure disorder. She remains on Keppra 500 mg twice a day.  Her last seizure occurred in August 2020.  She indicates she did not miss any doses of her Keppra at that time.  She says she was talking to her mom, she fell to the ground and had a full body seizure.  She did not go to the hospital, because she came around quickly. She cannot identify any triggers.  Prior to the seizure, her last seizure occurred in April 2019, in the setting of missed doses of Keppra.  She works full-time at a Architectural [employer].  She does not drive a car.  She indicates overall she has been doing well.  She lives with her mom.  Her blood pressure is elevated today.  Previously, EEG has been normal.  MRI of the brain was normal in 2018.  She says she is tolerating Keppra without side effect.  She presents today for follow-up unaccompanied.   REVIEW OF SYSTEMS: Out of a complete 14 system review of symptoms, the patient complains only of the following symptoms, and all other reviewed systems are negative.  Seizure  ALLERGIES: No Known Allergies  HOME MEDICATIONS: Outpatient Medications Prior to Visit  Medication Sig Dispense Refill  .  Multiple Vitamin (MULTIVITAMIN WITH MINERALS) TABS tablet Take 1 tablet by mouth daily.    . folic acid (FOLVITE) 1 MG tablet Take 1 tablet (1 mg total) by mouth daily. 90 tablet 3  . levETIRAcetam (KEPPRA) 750 MG tablet TAKE 1 TABLET BY MOUTH TWICE A DAY 180 tablet 0   No facility-administered medications prior to visit.    PAST MEDICAL HISTORY: Past Medical History:  Diagnosis Date  . Borderline hypertension   . GERD (gastroesophageal reflux disease)   . Hypertension   . Seizures (HCC)     PAST SURGICAL HISTORY: No past surgical history on file.  FAMILY HISTORY: Family History  Problem Relation Age of Onset  . Hypertension Father   . Cervical cancer Maternal Grandmother   . Hypertension Maternal Grandmother   . Seizures Maternal Grandmother   . Seizures Maternal Grandfather     SOCIAL HISTORY: Social History   Socioeconomic History  . Marital status: Single    Spouse name: Not on file  . Number of children: 0  . Years of education: 30  . Highest education level: Not on file  Occupational History    Comment: graduated from college  Tobacco Use  . Smoking status: Former Games developer  . Smokeless tobacco: Never Used  Substance and Sexual Activity  . Alcohol use: Yes    Alcohol/week: 2.0 standard drinks    Types: 1 Cans of beer, 1 Shots of liquor per week  Comment: 1-2 drinks per week  . Drug use: Yes    Types: Marijuana  . Sexual activity: Yes    Birth control/protection: None  Other Topics Concern  . Not on file  Social History Narrative   Resides w/ roommate in student living   Left-handed   Caffeine: none   Social Determinants of Health   Financial Resource Strain:   . Difficulty of Paying Living Expenses:   Food Insecurity:   . Worried About Charity fundraiser in the Last Year:   . Arboriculturist in the Last Year:   Transportation Needs:   . Film/video editor (Medical):   Marland Kitchen Lack of Transportation (Non-Medical):   Physical Activity:   . Days  of Exercise per Week:   . Minutes of Exercise per Session:   Stress:   . Feeling of Stress :   Social Connections:   . Frequency of Communication with Friends and Family:   . Frequency of Social Gatherings with Friends and Family:   . Attends Religious Services:   . Active Member of Clubs or Organizations:   . Attends Archivist Meetings:   Marland Kitchen Marital Status:   Intimate Partner Violence:   . Fear of Current or Ex-Partner:   . Emotionally Abused:   Marland Kitchen Physically Abused:   . Sexually Abused:    PHYSICAL EXAM  Vitals:   12/14/19 0916  BP: (!) 146/98  Pulse: 70  Weight: 117 lb (53.1 kg)  Height: 5\' 9"  (1.753 m)   Body mass index is 17.28 kg/m.  Generalized: Well developed, in no acute distress   Neurological examination  Mentation: Alert oriented to time, place, history taking. Follows all commands speech and language fluent Cranial nerve II-XII: Pupils were equal round reactive to light. Extraocular movements were full, visual field were full on confrontational test. Facial sensation and strength were normal. Head turning and shoulder shrug  were normal and symmetric. Motor: The motor testing reveals 5 over 5 strength of all 4 extremities. Good symmetric motor tone is noted throughout.  Sensory: Sensory testing is intact to soft touch on all 4 extremities. No evidence of extinction is noted.  Coordination: Cerebellar testing reveals good finger-nose-finger and heel-to-shin bilaterally.  Gait and station: Gait is normal. Tandem gait is normal. Romberg is negative. No drift is seen.  Reflexes: Deep tendon reflexes are symmetric and normal bilaterally.   DIAGNOSTIC DATA (LABS, IMAGING, TESTING) - I reviewed patient records, labs, notes, testing and imaging myself where available.  Lab Results  Component Value Date   WBC 9.0 10/11/2017   HGB 12.8 10/11/2017   HCT 40.3 10/11/2017   MCV 96.9 10/11/2017   PLT 223 10/11/2017      Component Value Date/Time   NA 142  10/11/2017 1517   K 3.2 (L) 10/11/2017 1517   CL 106 10/11/2017 1517   CO2 14 (L) 10/11/2017 1517   GLUCOSE 128 (H) 10/11/2017 1517   BUN 9 10/11/2017 1517   CREATININE 0.74 10/11/2017 1517   CALCIUM 9.6 10/11/2017 1517   PROT 6.2 09/11/2017 1216   ALBUMIN 4.0 09/11/2017 1216   AST 20 09/11/2017 1216   ALT 6 09/11/2017 1216   ALKPHOS 58 09/11/2017 1216   BILITOT 0.3 09/11/2017 1216   GFRNONAA >60 10/11/2017 1517   GFRAA >60 10/11/2017 1517   No results found for: CHOL, HDL, LDLCALC, LDLDIRECT, TRIG, CHOLHDL No results found for: HGBA1C No results found for: VITAMINB12 No results found for: TSH  ASSESSMENT AND PLAN 24 y.o. year old female  has a past medical history of Borderline hypertension, GERD (gastroesophageal reflux disease), Hypertension, and Seizures (HCC). here with:  1.  Seizures  Her last seizure occurred in August 2020.  She will remain on Keppra 750 mg twice a day, along with folic acid 1 mg daily.  I have given her printed prescriptions, we discussed good Rx, she has lost her health insurance, as her father passed away unexpectedly recently.  She will call for recurrent seizure, otherwise follow-up 1 year or sooner if needed.  I spent 20 minutes of face-to-face and non-face-to-face time with patient.  This included previsit chart review, lab review, study review, order entry, electronic health record documentation, patient education.  Margie Ege, AGNP-C, DNP 12/14/2019, 9:57 AM [facility] [address] [place], [place] [address] [phone]

## 2019-12-14 NOTE — Patient Instructions (Signed)
It was nice to see you today I am so sorry about the loss of your father  Continue Keppra and folic acid Call for recurrent seizure, see you in 1 year

## 2019-12-15 NOTE — Progress Notes (Signed)
I have read the note, and I agree with the clinical assessment and plan.  Philemon Riedesel K Clothilde Tippetts   

## 2020-01-03 ENCOUNTER — Telehealth: Payer: Self-pay | Admitting: Neurology

## 2020-01-03 MED ORDER — LEVETIRACETAM 750 MG PO TABS: ORAL_TABLET | ORAL | 11 refills | Status: DC

## 2020-01-03 NOTE — Telephone Encounter (Signed)
Order placed

## 2020-01-03 NOTE — Addendum Note (Signed)
Addended by: Guy Begin on: 01/03/2020 04:07 PM   Modules accepted: Orders

## 2020-01-03 NOTE — Telephone Encounter (Signed)
Pt is calling re:her levETIRAcetam (KEPPRA) 750 MG tablet pt states her medication was supposed to be called in the [facility] on [address] but it has not been sent there yet. Pt now has a week remaining. Pt is asking for a call on when this will be done

## 2020-02-06 ENCOUNTER — Telehealth: Payer: Self-pay | Admitting: Neurology

## 2020-02-06 NOTE — Telephone Encounter (Signed)
Pt is requesting a refill for levETIRAcetam (KEPPRA) 750 MG tablet.  Pharmacy:  [facility] [phone]

## 2020-02-06 NOTE — Telephone Encounter (Signed)
I called pt and relayed that she has refills available for her to get from [facility].  (11 refills).  She will call them.

## 2020-12-17 ENCOUNTER — Ambulatory Visit: Payer: Self-pay | Admitting: Neurology

## 2021-01-15 ENCOUNTER — Ambulatory Visit (INDEPENDENT_AMBULATORY_CARE_PROVIDER_SITE_OTHER): Payer: Self-pay | Admitting: Neurology

## 2021-01-15 ENCOUNTER — Encounter: Payer: Self-pay | Admitting: Neurology

## 2021-01-15 VITALS — BP 132/92 | HR 66 | Ht 69.0 in | Wt 114.8 lb

## 2021-01-15 DIAGNOSIS — R569 Unspecified convulsions: Secondary | ICD-10-CM

## 2021-01-15 MED ORDER — FOLIC ACID 1 MG PO TABS: 1.0000 mg | ORAL_TABLET | Freq: Every day | ORAL | 11 refills | Status: DC

## 2021-01-15 MED ORDER — LEVETIRACETAM 750 MG PO TABS: ORAL_TABLET | ORAL | 11 refills | Status: DC

## 2021-01-15 NOTE — Progress Notes (Signed)
I have read the note, and I agree with the clinical assessment and plan.  Rubin Dais K Briseis Aguilera   

## 2021-01-15 NOTE — Patient Instructions (Signed)
Continue current medications Call for seizures See you back in 1 year  

## 2021-01-15 NOTE — Progress Notes (Signed)
PATIENT: Meghan Williams DOB: 01-10-1996  REASON FOR VISIT: follow up HISTORY FROM: patient  HISTORY OF PRESENT ILLNESS: Today 01/15/21  Meghan Williams is a 25 year old female with history of seizure disorder.  She remains on Keppra and folic acid.  Last seizure was in August 2020.  Denies adverse effects of Keppra.  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  She has a driver's license, but chooses not to drive.  She does not have health insurance.  Here today for evaluation unaccompanied.  Update 12/14/2019 SS: Meghan Williams 25 year old female with history of seizure disorder.  Her last seizure was in August 2020, was compliant with Keppra at the time.  She remains on Keppra 750 mg twice a day, and folic acid 1 mg daily.  She is overall doing well, no recurrent seizure.  Unfortunately, her father passed away with Covid complications, he was [employer] Emergency planning/management officer, she has lost her health insurance.  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 She doesn't drive a car, she Uber's everywhere.  Denies any problems or concerns.  Presents today for evaluation unaccompanied.  HISTORY 06/16/2019 SS: Meghan Williams is a 25 year old female with history of seizure disorder. She remains on Keppra 500 mg twice a day.  Her last seizure occurred in August 2020.  She indicates she did not miss any doses of her Keppra at that time.  She says she was talking to her mom, she fell to the ground and had a full body seizure.  She did not go to the hospital, because she came around quickly. She cannot identify any triggers.  Prior to the seizure, her last seizure occurred in April 2019, in the setting of missed doses of Keppra.  She works full-time at a Architectural technologist.  She does not drive a car.  She indicates overall she has been doing well.  She lives with her mom.  Her blood pressure is elevated today.  Previously, EEG has been normal.  MRI of the brain  was normal in 2018.  She says she is tolerating Keppra without side effect.  She presents today for follow-up unaccompanied.   REVIEW OF SYSTEMS: Out of a complete 14 system review of symptoms, the patient complains only of the following symptoms, and all other reviewed systems are negative.  N/A  ALLERGIES: No Known Allergies  HOME MEDICATIONS: Outpatient Medications Prior to Visit  Medication Sig Dispense Refill   Multiple Vitamin (MULTIVITAMIN WITH MINERALS) TABS tablet Take 1 tablet by mouth daily.     folic acid (FOLVITE) 1 MG tablet Take 1 tablet (1 mg total) by mouth daily. 30 tablet 11   levETIRAcetam (KEPPRA) 750 MG tablet TAKE 1 TABLET BY MOUTH TWICE A DAY 60 tablet 11   No facility-administered medications prior to visit.    PAST MEDICAL HISTORY: Past Medical History:  Diagnosis Date   Borderline hypertension    GERD (gastroesophageal reflux disease)    Hypertension    Seizures (HCC)     PAST SURGICAL HISTORY: No past surgical history on file.  FAMILY HISTORY: Family History  Problem Relation Age of Onset   Hypertension Father    Cervical cancer Maternal Grandmother    Hypertension Maternal Grandmother    Seizures Maternal Grandmother    Seizures Maternal Grandfather     SOCIAL HISTORY: Social History   Socioeconomic History   Marital status: Single    Spouse name:  Not on file   Number of children: 0   Years of education: 15   Highest education level: Not on file  Occupational History    Comment: graduated from college  Tobacco Use   Smoking status: Former    Pack years: 0.00   Smokeless tobacco: Never  Vaping Use   Vaping Use: Never used  Substance and Sexual Activity   Alcohol use: Yes    Alcohol/week: 2.0 standard drinks    Types: 1 Cans of beer, 1 Shots of liquor per week    Comment: 1-2 drinks per week   Drug use: Yes    Types: Marijuana   Sexual activity: Yes    Birth control/protection: None  Other Topics Concern   Not on file   Social History Narrative   Resides w/ roommate in student living   Left-handed   Caffeine: none   Social Determinants of Health   Financial Resource Strain: Not on file  Food Insecurity: Not on file  Transportation Needs: Not on file  Physical Activity: Not on file  Stress: Not on file  Social Connections: Not on file  Intimate Partner Violence: Not on file   PHYSICAL EXAM  Vitals:   01/15/21 0947  BP: (!) 132/92  Pulse: 66  Weight: 114 lb 12.8 oz (52.1 kg)  Height: 5\' 9"  (1.753 m)    Body mass index is 16.95 kg/m.  Generalized: Well developed, in no acute distress   Neurological examination  Mentation: Alert oriented to time, place, history taking. Follows all commands speech and language fluent Cranial nerve II-XII: Pupils were equal round reactive to light. Extraocular movements were full, visual field were full on confrontational test. Facial sensation and strength were normal. Head turning and shoulder shrug  were normal and symmetric. Motor: The motor testing reveals 5 over 5 strength of all 4 extremities. Good symmetric motor tone is noted throughout.  Sensory: Sensory testing is intact to soft touch on all 4 extremities. No evidence of extinction is noted.  Coordination: Cerebellar testing reveals good finger-nose-finger and heel-to-shin bilaterally.  Gait and station: Gait is normal.  Reflexes: Deep tendon reflexes are symmetric and normal bilaterally.   DIAGNOSTIC DATA (LABS, IMAGING, TESTING) - I reviewed patient records, labs, notes, testing and imaging myself where available.  Lab Results  Component Value Date   WBC 9.0 10/11/2017   HGB 12.8 10/11/2017   HCT 40.3 10/11/2017   MCV 96.9 10/11/2017   PLT 223 10/11/2017      Component Value Date/Time   NA 142 10/11/2017 1517   K 3.2 (L) 10/11/2017 1517   CL 106 10/11/2017 1517   CO2 14 (L) 10/11/2017 1517   GLUCOSE 128 (H) 10/11/2017 1517   BUN 9 10/11/2017 1517   CREATININE 0.74 10/11/2017 1517    CALCIUM 9.6 10/11/2017 1517   PROT 6.2 09/11/2017 1216   ALBUMIN 4.0 09/11/2017 1216   AST 20 09/11/2017 1216   ALT 6 09/11/2017 1216   ALKPHOS 58 09/11/2017 1216   BILITOT 0.3 09/11/2017 1216   GFRNONAA >60 10/11/2017 1517   GFRAA >60 10/11/2017 1517   No results found for: CHOL, HDL, LDLCALC, LDLDIRECT, TRIG, CHOLHDL No results found for: 10/13/2017 No results found for: VITAMINB12 No results found for: TSH    ASSESSMENT AND PLAN 25 y.o. year old female  has a past medical history of Borderline hypertension, GERD (gastroesophageal reflux disease), Hypertension, and Seizures (HCC). here with:  1.  Seizures  -Continues to do well, last seizure was  August 2020 -Continue Keppra 750 mg twice a day, along with folic acid 1 mg daily -Call for seizure activity, otherwise follow-up 1 year or sooner if needed  Margie Ege, Edrick Oh, DNP 01/15/2021, 10:18 AM [facility] [address] [place], [place] [address] [phone]

## 2021-01-31 ENCOUNTER — Other Ambulatory Visit: Payer: Self-pay | Admitting: Neurology

## 2021-11-06 ENCOUNTER — Telehealth: Payer: Self-pay | Admitting: Neurology

## 2021-11-06 MED ORDER — LEVETIRACETAM 750 MG PO TABS: ORAL_TABLET | ORAL | 3 refills | Status: DC

## 2021-11-06 NOTE — Telephone Encounter (Signed)
?  Pt requesting refill for levETIRAcetam (KEPPRA) 750 MG tablet  ?At [facility] ([address], [place] [address]) ?Pharmacy Phone; [phone] ? ? ?

## 2021-11-06 NOTE — Telephone Encounter (Signed)
Rx refilled as per last office visit note. 

## 2022-01-15 ENCOUNTER — Ambulatory Visit: Payer: PRIVATE HEALTH INSURANCE | Admitting: Neurology

## 2022-01-20 NOTE — Progress Notes (Signed)
PATIENT: Meghan Williams DOB: 11-03-1995  REASON FOR VISIT: follow up for seizures HISTORY FROM: patient PRIMARY NEUROLOGIST: Willis/Camara  HISTORY OF PRESENT ILLNESS: Today 01/21/22  Meghan Williams is here today for follow-up.  Remains on Keppra.  Last seizure was in August 2020. Seizures started in 2017. Has had total of around 10 events. Lives in [place], [place] with her boyfriend.  Is a Production designer, theatre/television/film of a waxing studio.  She buys OTC 1 mg folic acid.  No new issues.  Update 01/15/21 SS: Meghan Williams is a 26 year old female with history of seizure disorder.  She remains on Keppra and folic acid.  Last seizure was in August 2020.  Denies adverse effects of Keppra.  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  She has a driver's license, but chooses not to drive.  She does not have health insurance.  Here today for evaluation unaccompanied.  Update 12/14/2019 SS: Meghan Williams 26 year old female with history of seizure disorder.  Her last seizure was in August 2020, was compliant with Keppra at the time.  She remains on Keppra 750 mg twice a day, and folic acid 1 mg daily.  She is overall doing well, no recurrent seizure.  Unfortunately, her father passed away with Covid complications, he was [employer] Emergency planning/management officer, she has lost her health insurance.  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 She doesn't drive a car, she Uber's everywhere.  Denies any problems or concerns.  Presents today for evaluation unaccompanied.  HISTORY 06/16/2019 SS: Meghan Williams is a 26 year old female with history of seizure disorder. She remains on Keppra 500 mg twice a day.  Her last seizure occurred in August 2020.  She indicates she did not miss any doses of her Keppra at that time.  She says she was talking to her mom, she fell to the ground and had a full body seizure.  She did not go to the hospital, because she came around quickly. She cannot  identify any triggers.  Prior to the seizure, her last seizure occurred in April 2019, in the setting of missed doses of Keppra.  She works full-time at a Architectural technologist.  She does not drive a car.  She indicates overall she has been doing well.  She lives with her mom.  Her blood pressure is elevated today.  Previously, EEG has been normal.  MRI of the brain was normal in 2018.  She says she is tolerating Keppra without side effect.  She presents today for follow-up unaccompanied.   REVIEW OF SYSTEMS: Out of a complete 14 system review of symptoms, the patient complains only of the following symptoms, and all other reviewed systems are negative.  N/A  ALLERGIES: No Known Allergies  HOME MEDICATIONS: Outpatient Medications Prior to Visit  Medication Sig Dispense Refill   folic acid (FOLVITE) 1 MG tablet Take 1 tablet (1 mg total) by mouth daily. 30 tablet 11   levETIRAcetam (KEPPRA) 750 MG tablet TAKE 1 TABLET BY MOUTH TWICE A DAY 60 tablet 3   Multiple Vitamin (MULTIVITAMIN WITH MINERALS) TABS tablet Take 1 tablet by mouth daily.     No facility-administered medications prior to visit.    PAST MEDICAL HISTORY: Past Medical History:  Diagnosis Date   Borderline hypertension    GERD (gastroesophageal reflux disease)    Hypertension    Seizures (HCC)     PAST SURGICAL HISTORY: History reviewed. No  pertinent surgical history.  FAMILY HISTORY: Family History  Problem Relation Age of Onset   Hypertension Father    Cervical cancer Maternal Grandmother    Hypertension Maternal Grandmother    Seizures Maternal Grandmother    Seizures Maternal Grandfather     SOCIAL HISTORY: Social History   Socioeconomic History   Marital status: Single    Spouse name: Not on file   Number of children: 0   Years of education: 15   Highest education level: Not on file  Occupational History    Comment: graduated from college  Tobacco Use   Smoking status: Former   Smokeless tobacco: Never   Building services engineer Use: Never used  Substance and Sexual Activity   Alcohol use: Yes    Alcohol/week: 2.0 standard drinks of alcohol    Types: 1 Cans of beer, 1 Shots of liquor per week    Comment: 1-2 drinks per week   Drug use: Yes    Types: Marijuana   Sexual activity: Yes    Birth control/protection: None  Other Topics Concern   Not on file  Social History Narrative   Resides w/ roommate in student living   Left-handed   Caffeine: none   Social Determinants of Health   Financial Resource Strain: Not on file  Food Insecurity: Not on file  Transportation Needs: Not on file  Physical Activity: Not on file  Stress: Not on file  Social Connections: Not on file  Intimate Partner Violence: Not on file   PHYSICAL EXAM  Vitals:   01/21/22 1340  BP: (!) 146/96  Pulse: 60  Weight: 121 lb (54.9 kg)  Height: 5\' 9"  (1.753 m)   Body mass index is 17.87 kg/m.  Generalized: Well developed, in no acute distress   Neurological examination  Mentation: Alert oriented to time, place, history taking. Follows all commands speech and language fluent Cranial nerve II-XII: Pupils were equal round reactive to light. Extraocular movements were full, visual field were full on confrontational test. Facial sensation and strength were normal. Head turning and shoulder shrug  were normal and symmetric. Motor: The motor testing reveals 5 over 5 strength of all 4 extremities. Good symmetric motor tone is noted throughout.  Sensory: Sensory testing is intact to soft touch on all 4 extremities. No evidence of extinction is noted.  Coordination: Cerebellar testing reveals good finger-nose-finger and heel-to-shin bilaterally.  Gait and station: Gait is normal.  Reflexes: Deep tendon reflexes are symmetric and normal bilaterally.   DIAGNOSTIC DATA (LABS, IMAGING, TESTING) - I reviewed patient records, labs, notes, testing and imaging myself where available.  Lab Results  Component Value Date    WBC 9.0 10/11/2017   HGB 12.8 10/11/2017   HCT 40.3 10/11/2017   MCV 96.9 10/11/2017   PLT 223 10/11/2017      Component Value Date/Time   NA 142 10/11/2017 1517   K 3.2 (L) 10/11/2017 1517   CL 106 10/11/2017 1517   CO2 14 (L) 10/11/2017 1517   GLUCOSE 128 (H) 10/11/2017 1517   BUN 9 10/11/2017 1517   CREATININE 0.74 10/11/2017 1517   CALCIUM 9.6 10/11/2017 1517   PROT 6.2 09/11/2017 1216   ALBUMIN 4.0 09/11/2017 1216   AST 20 09/11/2017 1216   ALT 6 09/11/2017 1216   ALKPHOS 58 09/11/2017 1216   BILITOT 0.3 09/11/2017 1216   GFRNONAA >60 10/11/2017 1517   GFRAA >60 10/11/2017 1517   No results found for: "CHOL", "HDL", "LDLCALC", "LDLDIRECT", "TRIG", "  CHOLHDL" No results found for: "HGBA1C" No results found for: "VITAMINB12" No results found for: "TSH"   ASSESSMENT AND PLAN 26 y.o. year old female  has a past medical history of Borderline hypertension, GERD (gastroesophageal reflux disease), Hypertension, and Seizures (HCC). here with:  1.  Seizures  -Doing well, last seizure was in August 2020 -Continue Keppra 750 mg twice a day, folic acid 1 mg daily -Check routine labs -Have recommended establishing with PCP for routine care -Call for seizure activity, otherwise follow-up 1 year 15 min VV  Margie Ege, AGNP-C, DNP 01/21/2022, 1:43 PM [facility] [address], [address] [place], [place] [address] [phone]

## 2022-01-21 ENCOUNTER — Ambulatory Visit (INDEPENDENT_AMBULATORY_CARE_PROVIDER_SITE_OTHER): Payer: Self-pay | Admitting: Neurology

## 2022-01-21 ENCOUNTER — Encounter: Payer: Self-pay | Admitting: Neurology

## 2022-01-21 VITALS — BP 146/96 | HR 60 | Ht 69.0 in | Wt 121.0 lb

## 2022-01-21 DIAGNOSIS — R569 Unspecified convulsions: Secondary | ICD-10-CM

## 2022-01-21 MED ORDER — LEVETIRACETAM 750 MG PO TABS: ORAL_TABLET | ORAL | 4 refills | Status: DC

## 2022-01-22 LAB — COMPREHENSIVE METABOLIC PANEL
ALT: 7 IU/L (ref 0–32)
AST: 26 IU/L (ref 0–40)
Albumin/Globulin Ratio: 1.7 (ref 1.2–2.2)
Albumin: 4.7 g/dL (ref 3.9–5.0)
Alkaline Phosphatase: 74 IU/L (ref 44–121)
BUN/Creatinine Ratio: 14 (ref 9–23)
BUN: 11 mg/dL (ref 6–20)
Bilirubin Total: 0.5 mg/dL (ref 0.0–1.2)
CO2: 22 mmol/L (ref 20–29)
Calcium: 9.8 mg/dL (ref 8.7–10.2)
Chloride: 103 mmol/L (ref 96–106)
Creatinine, Ser: 0.78 mg/dL (ref 0.57–1.00)
Globulin, Total: 2.8 g/dL (ref 1.5–4.5)
Glucose: 80 mg/dL (ref 70–99)
Potassium: 4.3 mmol/L (ref 3.5–5.2)
Sodium: 139 mmol/L (ref 134–144)
Total Protein: 7.5 g/dL (ref 6.0–8.5)
eGFR: 107 mL/min/{1.73_m2} (ref >59)

## 2022-01-22 LAB — CBC WITH DIFFERENTIAL/PLATELET
Basophils Absolute: 0.1 10*3/uL (ref 0.0–0.2)
Basos: 1 %
EOS (ABSOLUTE): 0.2 10*3/uL (ref 0.0–0.4)
Eos: 3 %
Hematocrit: 40.7 % (ref 34.0–46.6)
Hemoglobin: 14 g/dL (ref 11.1–15.9)
Immature Grans (Abs): 0 10*3/uL (ref 0.0–0.1)
Immature Granulocytes: 0 %
Lymphocytes Absolute: 2.8 10*3/uL (ref 0.7–3.1)
Lymphs: 45 %
MCH: 32.9 pg (ref 26.6–33.0)
MCHC: 34.4 g/dL (ref 31.5–35.7)
MCV: 96 fL (ref 79–97)
Monocytes Absolute: 0.6 10*3/uL (ref 0.1–0.9)
Monocytes: 9 %
Neutrophils Absolute: 2.6 10*3/uL (ref 1.4–7.0)
Neutrophils: 42 %
Platelets: 189 10*3/uL (ref 150–450)
RBC: 4.26 x10E6/uL (ref 3.77–5.28)
RDW: 11.6 % — ABNORMAL LOW (ref 11.7–15.4)
WBC: 6.1 10*3/uL (ref 3.4–10.8)

## 2022-01-22 LAB — LEVETIRACETAM LEVEL: Levetiracetam Lvl: 29 ug/mL (ref 10.0–40.0)

## 2022-05-22 ENCOUNTER — Ambulatory Visit: Payer: PRIVATE HEALTH INSURANCE | Admitting: Neurology

## 2023-01-21 ENCOUNTER — Telehealth (INDEPENDENT_AMBULATORY_CARE_PROVIDER_SITE_OTHER): Payer: Self-pay | Admitting: Neurology

## 2023-01-21 ENCOUNTER — Telehealth: Payer: Self-pay

## 2023-01-21 DIAGNOSIS — R569 Unspecified convulsions: Secondary | ICD-10-CM

## 2023-01-21 MED ORDER — FOLIC ACID 1 MG PO TABS: 1.0000 mg | ORAL_TABLET | Freq: Every day | ORAL | 11 refills | Status: DC

## 2023-01-21 MED ORDER — LEVETIRACETAM 750 MG PO TABS: ORAL_TABLET | ORAL | 4 refills | Status: DC

## 2023-01-21 NOTE — Progress Notes (Signed)
Patient scheduled for video visit, living in [place].  Will need to be rescheduled when she is in [place] for insurance and billing purposes.  She is coming to visit her mother next month, will let me know and we can squeeze in a video visit.  I went ahead and refilled her Keppra and folic acid.  Indicates she is doing well, no seizure events. No charge this video.     Meds ordered this encounter  Medications   levETIRAcetam (KEPPRA) 750 MG tablet    Sig: TAKE 1 TABLET BY MOUTH TWICE A DAY    Dispense:  180 tablet    Refill:  4   folic acid (FOLVITE) 1 MG tablet    Sig: Take 1 tablet (1 mg total) by mouth daily.    Dispense:  30 tablet    Refill:  11

## 2023-01-21 NOTE — Telephone Encounter (Signed)
Called and left msg to call back trying to inquire what state she is in as the video visits will only allow providers to see people in [place]

## 2023-04-19 ENCOUNTER — Other Ambulatory Visit: Payer: Self-pay | Admitting: Neurology

## 2024-05-02 ENCOUNTER — Other Ambulatory Visit: Payer: Self-pay | Admitting: Neurology

## 2024-06-01 ENCOUNTER — Ambulatory Visit: Payer: Self-pay | Admitting: Neurology

## 2024-06-13 ENCOUNTER — Telehealth: Payer: Self-pay | Admitting: Neurology

## 2024-06-13 MED ORDER — LEVETIRACETAM 750 MG PO TABS: 750.0000 mg | ORAL_TABLET | Freq: Two times a day (BID) | ORAL | 0 refills | Status: DC

## 2024-06-13 NOTE — Telephone Encounter (Signed)
 Last seen on 01/21/23 Follow up scheduled on 06/28/24  Rx sent

## 2024-06-13 NOTE — Telephone Encounter (Signed)
 Pt called to request medication refill  levETIRAcetam  (KEPPRA ) 750 MG tablet  Pt Pharmacy   [facility] [address] - [place], [place] - [address] AT [address] (Ph: [phone])

## 2024-06-28 ENCOUNTER — Ambulatory Visit: Payer: Self-pay | Admitting: Neurology

## 2024-06-28 ENCOUNTER — Encounter: Payer: Self-pay | Admitting: Neurology

## 2024-06-28 VITALS — BP 144/109 | HR 63 | Ht 69.0 in | Wt 118.5 lb

## 2024-06-28 DIAGNOSIS — R569 Unspecified convulsions: Secondary | ICD-10-CM

## 2024-06-28 MED ORDER — LEVETIRACETAM 750 MG PO TABS: 750.0000 mg | ORAL_TABLET | Freq: Two times a day (BID) | ORAL | 4 refills | Status: AC

## 2024-06-28 MED ORDER — FOLIC ACID 1 MG PO TABS: 1.0000 mg | ORAL_TABLET | Freq: Every day | ORAL | 3 refills | Status: AC

## 2024-06-28 NOTE — Progress Notes (Signed)
 PATIENT: Meghan Williams DOB: 07/04/1996  REASON FOR VISIT: follow up for seizures HISTORY FROM: patient PRIMARY NEUROLOGIST: Willis/Camara  HISTORY OF PRESENT ILLNESS: Today 06/28/24  06/28/24 SS: Has moved back to [place]. 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 No seizures. Remains on Keppra  750 mg twice daily. Takes daily, doesn't miss any doses. Taking folic acid  1 mg daily. Also takes multivitamin. No health issues. BP is up some today, 144/109, has not seen PCP. Does not have health insurance.   01/21/22 DD:[id number] Meghan Williams is here today for follow-up.  Remains on Keppra .  Last seizure was in August 2020. Seizures started in 2017. Has had total of around 10 events. Lives in [place], [place] with her boyfriend.  Is a production designer, theatre/television/film of a waxing studio.  She buys OTC 1 mg folic acid .  No new issues.  Update 01/15/21 SS: [name] is a 28 year old female with history of seizure disorder.  She remains on Keppra  and folic acid .  Last seizure was in August 2020.  Denies adverse effects of Keppra .  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  She has a driver's license, but chooses not to drive.  She does not have health insurance.  Here today for evaluation unaccompanied.  Update 12/14/2019 SS: [name] 28 year old female with history of seizure disorder.  Her last seizure was in August 2020, was compliant with Keppra  at the time.  She remains on Keppra  750 mg twice a day, and folic acid  1 mg daily.  She is overall doing well, no recurrent seizure.  Unfortunately, her father passed away with Covid complications, he was [employer] emergency planning/management officer, she has lost her health insurance.  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 She doesn't drive a car, she Uber's everywhere.  Denies any problems or concerns.  Presents today for evaluation unaccompanied.  HISTORY 06/16/2019 SS: [name] is a 28 year old female with history of seizure disorder. She  remains on Keppra  500 mg twice a day.  Her last seizure occurred in August 2020.  She indicates she did not miss any doses of her Keppra  at that time.  She says she was talking to her mom, she fell to the ground and had a full body seizure.  She did not go to the hospital, because she came around quickly. She cannot identify any triggers.  Prior to the seizure, her last seizure occurred in April 2019, in the setting of missed doses of Keppra .  She works full-time at a architectural technologist.  She does not drive a car.  She indicates overall she has been doing well.  She lives with her mom.  Her blood pressure is elevated today.  Previously, EEG has been normal.  MRI of the brain was normal in 2018.  She says she is tolerating Keppra  without side effect.  She presents today for follow-up unaccompanied.   REVIEW OF SYSTEMS: Out of a complete 14 system review of symptoms, the patient complains only of the following symptoms, and all other reviewed systems are negative.  N/A  ALLERGIES: No Known Allergies  HOME MEDICATIONS: Outpatient Medications Prior to Visit  Medication Sig Dispense Refill   levETIRAcetam  (KEPPRA ) 750 MG tablet Take 1 tablet (750 mg total) by mouth 2 (two) times daily. 60 tablet 0   Multiple Vitamin (MULTIVITAMIN WITH MINERALS) TABS tablet Take 1 tablet by mouth daily. (Patient not taking: Reported on 06/28/2024)  folic acid  (FOLVITE ) 1 MG tablet Take 1 tablet (1 mg total) by mouth daily. (Patient not taking: Reported on 06/28/2024) 30 tablet 11   No facility-administered medications prior to visit.    PAST MEDICAL HISTORY: Past Medical History:  Diagnosis Date   Borderline hypertension    GERD (gastroesophageal reflux disease)    Hypertension    Seizures (HCC)     PAST SURGICAL HISTORY: History reviewed. No pertinent surgical history.  FAMILY HISTORY: Family History  Problem Relation Age of Onset   Hypertension Father    Cervical cancer Maternal Grandmother     Hypertension Maternal Grandmother    Seizures Maternal Grandmother    Seizures Maternal Grandfather     SOCIAL HISTORY: Social History   Socioeconomic History   Marital status: Single    Spouse name: Not on file   Number of children: 0   Years of education: 15   Highest education level: Not on file  Occupational History    Comment: graduated from college  Tobacco Use   Smoking status: Former   Smokeless tobacco: Never  Vaping Use   Vaping status: Never Used  Substance and Sexual Activity   Alcohol use: Yes    Alcohol/week: 2.0 standard drinks of alcohol    Types: 1 Cans of beer, 1 Shots of liquor per week    Comment: 1-2 drinks per week   Drug use: Yes    Types: Marijuana   Sexual activity: Yes    Birth control/protection: None  Other Topics Concern   Not on file  Social History Narrative   Resides w/ roommate in student living   Left-handed   Caffeine: none   Social Drivers of Corporate Investment Banker Strain: Not on file  Food Insecurity: Not on file  Transportation Needs: Not on file  Physical Activity: Not on file  Stress: Not on file  Social Connections: Not on file  Intimate Partner Violence: Not on file   PHYSICAL EXAM  Vitals:   06/28/24 0737  BP: (!) 144/109  Pulse: 63  SpO2: 99%  Weight: 118 lb 8 oz (53.8 kg)  Height: 5' 9 (1.753 m)   Body mass index is 17.5 kg/m.  Generalized: Well developed, in no acute distress   Neurological examination  Mentation: Alert oriented to time, place, history taking. Follows all commands speech and language fluent Cranial nerve II-XII: Pupils were equal round reactive to light. Extraocular movements were full, visual field were full on confrontational test. Facial sensation and strength were normal. Head turning and shoulder shrug  were normal and symmetric. Motor: The motor testing reveals 5 over 5 strength of all 4 extremities. Good symmetric motor tone is noted throughout.  Sensory: Sensory testing is  intact to soft touch on all 4 extremities. No evidence of extinction is noted.  Coordination: Cerebellar testing reveals good finger-nose-finger and heel-to-shin bilaterally.  Gait and station: Gait is normal.  Reflexes: Deep tendon reflexes are symmetric and normal bilaterally.   DIAGNOSTIC DATA (LABS, IMAGING, TESTING) - I reviewed patient records, labs, notes, testing and imaging myself where available.  Lab Results  Component Value Date   WBC 6.1 01/21/2022   HGB 14.0 01/21/2022   HCT 40.7 01/21/2022   MCV 96 01/21/2022   PLT 189 01/21/2022      Component Value Date/Time   NA 139 01/21/2022 1357   K 4.3 01/21/2022 1357   CL 103 01/21/2022 1357   CO2 22 01/21/2022 1357   GLUCOSE 80 01/21/2022 1357  GLUCOSE 128 (H) 10/11/2017 1517   BUN 11 01/21/2022 1357   CREATININE 0.78 01/21/2022 1357   CALCIUM 9.8 01/21/2022 1357   PROT 7.5 01/21/2022 1357   ALBUMIN 4.7 01/21/2022 1357   AST 26 01/21/2022 1357   ALT 7 01/21/2022 1357   ALKPHOS 74 01/21/2022 1357   BILITOT 0.5 01/21/2022 1357   GFRNONAA >60 10/11/2017 1517   GFRAA >60 10/11/2017 1517   No results found for: CHOL, HDL, LDLCALC, LDLDIRECT, TRIG, CHOLHDL No results found for: YHAJ8R No results found for: VITAMINB12 No results found for: TSH  ASSESSMENT AND PLAN 28 y.o. year old female  has a past medical history of Borderline hypertension, GERD (gastroesophageal reflux disease), Hypertension, and Seizures (HCC). here with:  1.  Seizures  - Continues to do well, last seizure in August 2020 - Continue Keppra  750 mg twice a day, folic acid  1 mg daily - Recommend establish with PCP, she is going to contact [facility], her BP is up, she does not have health insurance, will ask her to request check Keppra  level  - Call for seizure activity, otherwise follow-up 1 year 15 min VV  Lauraine Born, AGNP-C, DNP 06/28/2024, 8:02 AM [facility] [address], [address] [place],  [place] [address] [phone]

## 2024-06-28 NOTE — Patient Instructions (Signed)
 Great to see you today! Continue Keppra  and folic acid  Recommend establish with primary care Monitor blood pressure, is up today Ask primary care to check Keppra  level when they do labs Call for seizure activity, follow-up in 1 year.  Thanks!!

## 2024-09-05 ENCOUNTER — Ambulatory Visit: Payer: Self-pay | Admitting: Neurology

## 2025-06-29 ENCOUNTER — Ambulatory Visit: Payer: PRIVATE HEALTH INSURANCE | Admitting: Neurology
